# Patient Record
Sex: Female | Born: 1950
Health system: Southern US, Community
[De-identification: ages and names within clinical notes are randomized; demographics above are authoritative.]

## PROBLEM LIST (undated history)

## (undated) DIAGNOSIS — B009 Herpesviral infection, unspecified: Secondary | ICD-10-CM

## (undated) DIAGNOSIS — T8859XA Other complications of anesthesia, initial encounter: Secondary | ICD-10-CM

## (undated) DIAGNOSIS — M1990 Unspecified osteoarthritis, unspecified site: Secondary | ICD-10-CM

## (undated) DIAGNOSIS — I10 Essential (primary) hypertension: Secondary | ICD-10-CM

## (undated) DIAGNOSIS — T4145XA Adverse effect of unspecified anesthetic, initial encounter: Secondary | ICD-10-CM

## (undated) DIAGNOSIS — R112 Nausea with vomiting, unspecified: Secondary | ICD-10-CM

## (undated) DIAGNOSIS — M8580 Other specified disorders of bone density and structure, unspecified site: Secondary | ICD-10-CM

## (undated) DIAGNOSIS — Z9889 Other specified postprocedural states: Secondary | ICD-10-CM

## (undated) HISTORY — DX: Essential (primary) hypertension: I10

## (undated) HISTORY — DX: Herpesviral infection, unspecified: B00.9

## (undated) HISTORY — DX: Other specified disorders of bone density and structure, unspecified site: M85.80

## (undated) HISTORY — PX: TONSILLECTOMY: SUR1361

---

## 1982-06-22 HISTORY — PX: TUBAL LIGATION: SHX77

## 1998-12-23 ENCOUNTER — Other Ambulatory Visit: Admission: RE | Admit: 1998-12-23 | Discharge: 1998-12-23 | Payer: Self-pay | Admitting: Gynecology

## 1998-12-25 ENCOUNTER — Ambulatory Visit (HOSPITAL_COMMUNITY): Admission: RE | Admit: 1998-12-25 | Discharge: 1998-12-25 | Payer: Self-pay | Admitting: Obstetrics and Gynecology

## 1998-12-25 ENCOUNTER — Encounter: Payer: Self-pay | Admitting: Obstetrics and Gynecology

## 1999-10-27 ENCOUNTER — Other Ambulatory Visit: Admission: RE | Admit: 1999-10-27 | Discharge: 1999-10-27 | Payer: Self-pay | Admitting: Gynecology

## 1999-12-29 ENCOUNTER — Encounter: Payer: Self-pay | Admitting: Obstetrics and Gynecology

## 1999-12-29 ENCOUNTER — Ambulatory Visit (HOSPITAL_COMMUNITY): Admission: RE | Admit: 1999-12-29 | Discharge: 1999-12-29 | Payer: Self-pay | Admitting: Obstetrics and Gynecology

## 2000-02-16 ENCOUNTER — Other Ambulatory Visit: Admission: RE | Admit: 2000-02-16 | Discharge: 2000-02-16 | Payer: Self-pay | Admitting: Gynecology

## 2001-12-30 ENCOUNTER — Ambulatory Visit (HOSPITAL_COMMUNITY): Admission: RE | Admit: 2001-12-30 | Discharge: 2001-12-30 | Payer: Self-pay | Admitting: Obstetrics and Gynecology

## 2001-12-30 ENCOUNTER — Encounter: Payer: Self-pay | Admitting: Obstetrics and Gynecology

## 2001-12-30 ENCOUNTER — Other Ambulatory Visit: Admission: RE | Admit: 2001-12-30 | Discharge: 2001-12-30 | Payer: Self-pay | Admitting: Gynecology

## 2002-01-16 ENCOUNTER — Encounter: Payer: Self-pay | Admitting: Obstetrics and Gynecology

## 2002-01-16 ENCOUNTER — Encounter: Admission: RE | Admit: 2002-01-16 | Discharge: 2002-01-16 | Payer: Self-pay | Admitting: Obstetrics and Gynecology

## 2003-02-05 ENCOUNTER — Encounter: Payer: Self-pay | Admitting: Obstetrics and Gynecology

## 2003-02-05 ENCOUNTER — Other Ambulatory Visit: Admission: RE | Admit: 2003-02-05 | Discharge: 2003-02-05 | Payer: Self-pay | Admitting: Gynecology

## 2003-02-05 ENCOUNTER — Ambulatory Visit (HOSPITAL_COMMUNITY): Admission: RE | Admit: 2003-02-05 | Discharge: 2003-02-05 | Payer: Self-pay | Admitting: Obstetrics and Gynecology

## 2004-02-20 ENCOUNTER — Other Ambulatory Visit: Admission: RE | Admit: 2004-02-20 | Discharge: 2004-02-20 | Payer: Self-pay | Admitting: Gynecology

## 2004-02-20 ENCOUNTER — Ambulatory Visit (HOSPITAL_COMMUNITY): Admission: RE | Admit: 2004-02-20 | Discharge: 2004-02-20 | Payer: Self-pay | Admitting: Gynecology

## 2005-02-27 ENCOUNTER — Ambulatory Visit (HOSPITAL_COMMUNITY): Admission: RE | Admit: 2005-02-27 | Discharge: 2005-02-27 | Payer: Self-pay | Admitting: Gynecology

## 2005-02-27 ENCOUNTER — Other Ambulatory Visit: Admission: RE | Admit: 2005-02-27 | Discharge: 2005-02-27 | Payer: Self-pay | Admitting: Gynecology

## 2006-06-21 ENCOUNTER — Other Ambulatory Visit: Admission: RE | Admit: 2006-06-21 | Discharge: 2006-06-21 | Payer: Self-pay | Admitting: Obstetrics and Gynecology

## 2006-06-21 ENCOUNTER — Ambulatory Visit (HOSPITAL_COMMUNITY): Admission: RE | Admit: 2006-06-21 | Discharge: 2006-06-21 | Payer: Self-pay | Admitting: Gynecology

## 2007-02-07 ENCOUNTER — Emergency Department (HOSPITAL_COMMUNITY): Admission: EM | Admit: 2007-02-07 | Discharge: 2007-02-07 | Payer: Self-pay | Admitting: Emergency Medicine

## 2008-06-21 ENCOUNTER — Ambulatory Visit (HOSPITAL_COMMUNITY): Admission: RE | Admit: 2008-06-21 | Discharge: 2008-06-21 | Payer: Self-pay | Admitting: Gynecology

## 2008-06-21 ENCOUNTER — Encounter: Payer: Self-pay | Admitting: Women's Health

## 2008-06-21 ENCOUNTER — Ambulatory Visit: Payer: Self-pay | Admitting: Women's Health

## 2008-06-21 ENCOUNTER — Other Ambulatory Visit: Admission: RE | Admit: 2008-06-21 | Discharge: 2008-06-21 | Payer: Self-pay | Admitting: Obstetrics and Gynecology

## 2008-11-20 ENCOUNTER — Emergency Department (HOSPITAL_COMMUNITY): Admission: EM | Admit: 2008-11-20 | Discharge: 2008-11-21 | Payer: Self-pay | Admitting: Emergency Medicine

## 2009-11-29 ENCOUNTER — Ambulatory Visit: Payer: Self-pay | Admitting: Women's Health

## 2009-11-29 ENCOUNTER — Other Ambulatory Visit: Admission: RE | Admit: 2009-11-29 | Discharge: 2009-11-29 | Payer: Self-pay | Admitting: Gynecology

## 2009-11-29 ENCOUNTER — Ambulatory Visit (HOSPITAL_COMMUNITY): Admission: RE | Admit: 2009-11-29 | Discharge: 2009-11-29 | Payer: Self-pay | Admitting: Gynecology

## 2010-02-13 ENCOUNTER — Ambulatory Visit: Payer: Self-pay | Admitting: Women's Health

## 2010-03-21 ENCOUNTER — Ambulatory Visit: Payer: Self-pay | Admitting: Gynecology

## 2010-04-11 ENCOUNTER — Ambulatory Visit: Payer: Self-pay | Admitting: Gynecology

## 2010-07-13 ENCOUNTER — Encounter: Payer: Self-pay | Admitting: Gynecology

## 2010-09-29 LAB — COMPREHENSIVE METABOLIC PANEL
ALT: 13 U/L (ref 0–35)
AST: 25 U/L (ref 0–37)
Albumin: 3.5 g/dL (ref 3.5–5.2)
Alkaline Phosphatase: 82 U/L (ref 39–117)
BUN: 3 mg/dL — ABNORMAL LOW (ref 6–23)
CO2: 28 mEq/L (ref 19–32)
Calcium: 9.2 mg/dL (ref 8.4–10.5)
Chloride: 90 mEq/L — ABNORMAL LOW (ref 96–112)
Creatinine, Ser: 0.69 mg/dL (ref 0.4–1.2)
GFR calc Af Amer: >60 mL/min (ref >60)
GFR calc non Af Amer: >60 mL/min (ref >60)
Glucose, Bld: 122 mg/dL — ABNORMAL HIGH (ref 70–99)
Potassium: 3.6 mEq/L (ref 3.5–5.1)
Sodium: 129 mEq/L — ABNORMAL LOW (ref 135–145)
Total Bilirubin: 0.5 mg/dL (ref 0.3–1.2)
Total Protein: 6.4 g/dL (ref 6.0–8.3)

## 2010-09-29 LAB — URINE CULTURE
Colony Count: NO GROWTH
Culture: NO GROWTH

## 2010-09-29 LAB — URINALYSIS, ROUTINE W REFLEX MICROSCOPIC
Bilirubin Urine: NEGATIVE
Glucose, UA: NEGATIVE mg/dL
Hgb urine dipstick: NEGATIVE
Ketones, ur: NEGATIVE mg/dL
Nitrite: NEGATIVE
Protein, ur: NEGATIVE mg/dL
Specific Gravity, Urine: 1.004 — ABNORMAL LOW (ref 1.005–1.030)
Urobilinogen, UA: 0.2 mg/dL (ref 0.0–1.0)
pH: 6 (ref 5.0–8.0)

## 2010-09-29 LAB — CBC
HCT: 33.7 % — ABNORMAL LOW (ref 36.0–46.0)
Hemoglobin: 11.4 g/dL — ABNORMAL LOW (ref 12.0–15.0)
MCHC: 33.9 g/dL (ref 30.0–36.0)
MCV: 95.2 fL (ref 78.0–100.0)
Platelets: 460 10*3/uL — ABNORMAL HIGH (ref 150–400)
RBC: 3.54 MIL/uL — ABNORMAL LOW (ref 3.87–5.11)
RDW: 12.7 % (ref 11.5–15.5)
WBC: 10.9 10*3/uL — ABNORMAL HIGH (ref 4.0–10.5)

## 2010-09-29 LAB — DIFFERENTIAL
Basophils Absolute: 0.1 10*3/uL (ref 0.0–0.1)
Basophils Relative: 1 % (ref 0–1)
Eosinophils Absolute: 0.2 10*3/uL (ref 0.0–0.7)
Eosinophils Relative: 2 % (ref 0–5)
Lymphocytes Relative: 17 % (ref 12–46)
Lymphs Abs: 1.9 10*3/uL (ref 0.7–4.0)
Monocytes Absolute: 1.1 10*3/uL — ABNORMAL HIGH (ref 0.1–1.0)
Monocytes Relative: 10 % (ref 3–12)
Neutro Abs: 7.7 10*3/uL (ref 1.7–7.7)
Neutrophils Relative %: 70 % (ref 43–77)

## 2010-09-29 LAB — POCT CARDIAC MARKERS
CKMB, poc: 1.3 ng/mL (ref 1.0–8.0)
Myoglobin, poc: 108 ng/mL (ref 12–200)
Troponin i, poc: <0.05 ng/mL (ref 0.00–0.09)

## 2010-11-04 NOTE — Consult Note (Signed)
NAME:  Evelyn Nelson, Evelyn Nelson NO.:  [id number]   MEDICAL RECORD NO.:  [id number]          PATIENT TYPE:  EMS   LOCATION:  ED                           FACILITY:  [facility]   PHYSICIAN:  Wilson Singer, M.D.DATE OF BIRTH:  1950-11-17   DATE OF CONSULTATION:  02/07/2007  DATE OF DISCHARGE:                                 CONSULTATION   PRIMARY CARE PHYSICIAN:  Lianne Bushy, M.D.   REFERRING PHYSICIAN:  Joen Laura, M.D. in the emergency room.   HISTORY:  This is a very pleasant 61 year old lady who had a less than  30 second episode of loss of consciousness while in the shower this  morning.  She was feeling perfectly well yesterday and in the last few  weeks and she went into the shower and felt lightheaded and passed out.  She regained consciousness within less than 30 seconds and was perfectly  orientated in time, place, and person.  There was no tongue biting,  urinary incontinence.  There was no history of chest pain or  palpitations prior to post the event.  She has not had any recent such  symptoms in the last few weeks.  She has had 1 episode of passing out  before but this was when she had gastroenteritis and was told she was  severely dehydrated.  She has no history of coronary artery disease in  the past and there is no history of arrhythmia in the past.  She has no  history of seizure disorder in the past.   PAST MEDICAL HISTORY:  No serious illnesses.   PAST SURGICAL HISTORY:  No operations.   MEDICATIONS:  None.   ALLERGIES:  None.   SOCIAL HISTORY:  She does not smoke and does not drink alcohol  excessively.  She is divorced and lives alone with her son.   REVIEW OF SYSTEMS:  Apart from the symptoms mentioned above, there were  no other symptoms reviewed in all systems.   PHYSICAL EXAMINATION:  VITAL SIGNS:  She is afebrile and hemodynamically  stable.  Temperature 97, blood pressure 122/76, pulse 70, respiratory  rate 12, saturation 100%.  GENERAL:  She looks well.  HEART:  Sounds are presently normal without murmurs.  LUNGS:  Fields are clear.  NEUROLOGIC:  There are no focal neurologic signs.  She is alert and  oriented.  ABDOMEN:  Soft and nontender with no evidence of abdominal aortic  aneurysm.   INVESTIGATIONS:  Show hemoglobin 12, white blood cell count 9.4, with  platelets 291.  Sodium 138, potassium 3.6, bicarbonate 22, glucose 87,  BUN 11, creatinine 0.73.  Electrocardiogram done in the emergency room  shows a normal sinus rhythm and is within normal limits.   IMPRESSION:  Syncope likely vasovagal/fainting.   PLAN:  I think this patient can go home and be investigated as an  outpatient.  I have told her to drink plenty of fluids today to make  sure she is well hydrated.      Wilson Singer, M.D.  Electronically Signed     NCG/MEDQ  D:  02/07/2007  T:  02/07/2007  Job:  [id number]   cc:   Lianne Bushy, M.D.  Fax: [phone]

## 2010-11-10 ENCOUNTER — Other Ambulatory Visit: Payer: Self-pay | Admitting: Women's Health

## 2010-11-10 DIAGNOSIS — Z1231 Encounter for screening mammogram for malignant neoplasm of breast: Secondary | ICD-10-CM

## 2010-12-12 ENCOUNTER — Encounter (INDEPENDENT_AMBULATORY_CARE_PROVIDER_SITE_OTHER): Payer: BC Managed Care – PPO | Admitting: Women's Health

## 2010-12-12 ENCOUNTER — Other Ambulatory Visit (HOSPITAL_COMMUNITY)
Admission: RE | Admit: 2010-12-12 | Discharge: 2010-12-12 | Disposition: A | Discharge status: Home / Self Care | Payer: BC Managed Care – PPO | Source: Ambulatory Visit | Attending: Gynecology | Admitting: Gynecology

## 2010-12-12 ENCOUNTER — Other Ambulatory Visit: Payer: Self-pay | Admitting: Women's Health

## 2010-12-12 ENCOUNTER — Ambulatory Visit (HOSPITAL_COMMUNITY)
Admission: RE | Admit: 2010-12-12 | Discharge: 2010-12-12 | Disposition: A | Discharge status: Home / Self Care | Payer: BC Managed Care – PPO | Source: Ambulatory Visit | Attending: Women's Health | Admitting: Women's Health

## 2010-12-12 DIAGNOSIS — Z124 Encounter for screening for malignant neoplasm of cervix: Secondary | ICD-10-CM | POA: Insufficient documentation

## 2010-12-12 DIAGNOSIS — Z833 Family history of diabetes mellitus: Secondary | ICD-10-CM

## 2010-12-12 DIAGNOSIS — Z1231 Encounter for screening mammogram for malignant neoplasm of breast: Secondary | ICD-10-CM

## 2010-12-12 DIAGNOSIS — Z13220 Encounter for screening for lipoid disorders: Secondary | ICD-10-CM

## 2010-12-12 DIAGNOSIS — Z01419 Encounter for gynecological examination (general) (routine) without abnormal findings: Secondary | ICD-10-CM

## 2011-01-21 ENCOUNTER — Encounter: Payer: Self-pay | Admitting: Women's Health

## 2011-01-21 ENCOUNTER — Telehealth: Payer: Self-pay | Admitting: Women's Health

## 2011-01-21 ENCOUNTER — Encounter: Payer: Self-pay | Admitting: *Deleted

## 2011-01-21 NOTE — Telephone Encounter (Signed)
Left to phone message is to call office regarding cholesterol levels. Patient returned call requesting  A letter be sent with information. A letter was sent.

## 2011-04-03 LAB — URINALYSIS, ROUTINE W REFLEX MICROSCOPIC
Bilirubin Urine: NEGATIVE
Glucose, UA: NEGATIVE
Hgb urine dipstick: NEGATIVE
Ketones, ur: NEGATIVE
Nitrite: NEGATIVE
Protein, ur: NEGATIVE
Specific Gravity, Urine: 1.016
Urobilinogen, UA: 0.2
pH: 5

## 2011-04-03 LAB — BASIC METABOLIC PANEL
BUN: 11
CO2: 22
Calcium: 8.8
Chloride: 105
Creatinine, Ser: 0.73
GFR calc Af Amer: >60
GFR calc non Af Amer: >60
Glucose, Bld: 87
Potassium: 3.6
Sodium: 138

## 2011-04-03 LAB — CBC
HCT: 35.1 — ABNORMAL LOW
Hemoglobin: 12
MCHC: 34.1
MCV: 94.4
Platelets: 291
RBC: 3.72 — ABNORMAL LOW
RDW: 13.2
WBC: 9.4

## 2011-04-03 LAB — DIFFERENTIAL
Basophils Absolute: 0
Basophils Relative: 0
Eosinophils Absolute: 0.2
Eosinophils Relative: 2
Lymphocytes Relative: 8 — ABNORMAL LOW
Lymphs Abs: 0.8
Monocytes Absolute: 0.5
Monocytes Relative: 6
Neutro Abs: 7.9 — ABNORMAL HIGH
Neutrophils Relative %: 84 — ABNORMAL HIGH

## 2011-04-03 LAB — POCT CARDIAC MARKERS
CKMB, poc: <1 — ABNORMAL LOW
CKMB, poc: <1 — ABNORMAL LOW
Myoglobin, poc: 65.1
Myoglobin, poc: 73.9
Operator id: 1627
Operator id: 4001
Troponin i, poc: <0.05
Troponin i, poc: <0.05

## 2012-03-01 ENCOUNTER — Other Ambulatory Visit: Payer: Self-pay | Admitting: Women's Health

## 2012-03-01 DIAGNOSIS — Z1231 Encounter for screening mammogram for malignant neoplasm of breast: Secondary | ICD-10-CM

## 2012-03-17 ENCOUNTER — Encounter: Payer: Self-pay | Admitting: Women's Health

## 2012-03-17 ENCOUNTER — Ambulatory Visit (HOSPITAL_COMMUNITY)
Admission: RE | Admit: 2012-03-17 | Discharge: 2012-03-17 | Disposition: A | Discharge status: Home / Self Care | Payer: BC Managed Care – PPO | Source: Ambulatory Visit | Attending: Women's Health | Admitting: Women's Health

## 2012-03-17 ENCOUNTER — Ambulatory Visit (INDEPENDENT_AMBULATORY_CARE_PROVIDER_SITE_OTHER): Payer: BC Managed Care – PPO | Admitting: Women's Health

## 2012-03-17 VITALS — BP 110/80 | Ht 67.0 in | Wt 167.0 lb

## 2012-03-17 DIAGNOSIS — M899 Disorder of bone, unspecified: Secondary | ICD-10-CM

## 2012-03-17 DIAGNOSIS — Z833 Family history of diabetes mellitus: Secondary | ICD-10-CM

## 2012-03-17 DIAGNOSIS — Z1231 Encounter for screening mammogram for malignant neoplasm of breast: Secondary | ICD-10-CM | POA: Insufficient documentation

## 2012-03-17 DIAGNOSIS — Z01419 Encounter for gynecological examination (general) (routine) without abnormal findings: Secondary | ICD-10-CM

## 2012-03-17 DIAGNOSIS — Z13220 Encounter for screening for lipoid disorders: Secondary | ICD-10-CM

## 2012-03-17 DIAGNOSIS — M8580 Other specified disorders of bone density and structure, unspecified site: Secondary | ICD-10-CM

## 2012-03-17 LAB — CBC WITH DIFFERENTIAL/PLATELET
Basophils Absolute: 0.1 10*3/uL (ref 0.0–0.1)
Basophils Relative: 1 % (ref 0–1)
Eosinophils Absolute: 0.6 10*3/uL (ref 0.0–0.7)
Eosinophils Relative: 8 % — ABNORMAL HIGH (ref 0–5)
HCT: 38.2 % (ref 36.0–46.0)
Hemoglobin: 13 g/dL (ref 12.0–15.0)
Lymphocytes Relative: 28 % (ref 12–46)
Lymphs Abs: 2.2 10*3/uL (ref 0.7–4.0)
MCH: 30.7 pg (ref 26.0–34.0)
MCHC: 34 g/dL (ref 30.0–36.0)
MCV: 90.1 fL (ref 78.0–100.0)
Monocytes Absolute: 0.6 10*3/uL (ref 0.1–1.0)
Monocytes Relative: 8 % (ref 3–12)
Neutro Abs: 4.5 10*3/uL (ref 1.7–7.7)
Neutrophils Relative %: 55 % (ref 43–77)
Platelets: 272 10*3/uL (ref 150–400)
RBC: 4.24 MIL/uL (ref 3.87–5.11)
RDW: 12.9 % (ref 11.5–15.5)
WBC: 7.9 10*3/uL (ref 4.0–10.5)

## 2012-03-17 NOTE — Progress Notes (Signed)
Evelyn Nelson 1951-01-22 [id number]    History:    The patient presents for annual exam.  Postmenopausal on no HRT with no bleeding. History of normal Paps and mammograms. DEXA 2011 osteopenia T score -1.9 at left femoral neck. Had a normal PTH, calcium level and comprehensive metabolic panel. Fractious a ten-year probability of a major fractured 18% in the hip fracture 1.2%. She is taking 2009 use vitamin D daily and calcium rich diet with supplement. Has not had a colonoscopy. Had a knee injury this past year from a fall.   Past medical history, past surgical history, family history and social history were all reviewed and documented in the EPIC chart. Works at a [employer]. Has 2 children. Not sexually active. Son has lymph node Hodgkin's cancer.    ROS:  A  ROS was performed and pertinent positives and negatives are included in the history.  Exam:  Filed Vitals:   03/17/12 0923  BP: 110/80    General appearance:  Normal Head/Neck:  Normal, without cervical or supraclavicular adenopathy. Thyroid:  Symmetrical, normal in size, without palpable masses or nodularity. Respiratory  Effort:  Normal  Auscultation:  Clear without wheezing or rhonchi Cardiovascular  Auscultation:  Regular rate, without rubs, murmurs or gallops  Edema/varicosities:  Not grossly evident Abdominal  Soft,nontender, without masses, guarding or rebound.  Liver/spleen:  No organomegaly noted  Hernia:  None appreciated  Skin  Inspection:  Grossly normal  Palpation:  Grossly normal Neurologic/psychiatric  Orientation:  Normal with appropriate conversation.  Mood/affect:  Normal  Genitourinary    Breasts: Examined lying and sitting.     Right: Without masses, retractions, discharge or axillary adenopathy.     Left: Without masses, retractions, discharge or axillary adenopathy.   Inguinal/mons:  Normal without inguinal adenopathy  External genitalia:  Normal  BUS/Urethra/Skene's glands:   Normal  Bladder:  Normal  Vagina:  +1 cystocele   Cervix:  Normal  Uterus:  normal in size, shape and contour.  Midline and mobile  Adnexa/parametria:     Rt: Without masses or tenderness.   Lt: Without masses or tenderness.  Anus and perineum: Normal  Digital rectal exam: Normal sphincter tone without palpated masses or tenderness  Assessment/Plan:  61 y.o. DW F. G2 P2  for annual exam with no complaints.  Postmenopausal  no HRT or bleeding/ +1 cystocele with stress incontinence Osteopenia  T score of -1.9 at our on the 2011  Plan: Repeat bone density will schedule. Has not had a screening colonoscopy reviewed importance states will schedule. SBE's, continue annual mammogram, calcium rich diet, continued vitamin D 2000 daily and exercise encouraged. Home safety and fall prevention discussed. CBC, lipid panel, glucose, UA, home Hemoccult card given. No Pap history of normal Paps, new screening guidelines reviewed. Not sexually active reviewed condom use if becomes.      [name] [facility], 9:58 AM 03/17/2012

## 2012-03-17 NOTE — Patient Instructions (Addendum)

## 2012-03-18 LAB — LIPID PANEL
Cholesterol: 220 mg/dL — ABNORMAL HIGH (ref 0–200)
HDL: 115 mg/dL (ref >39)
LDL Cholesterol: 92 mg/dL (ref 0–99)
Total CHOL/HDL Ratio: 1.9 Ratio
Triglycerides: 67 mg/dL (ref <150)
VLDL: 13 mg/dL (ref 0–40)

## 2012-03-18 LAB — URINALYSIS W MICROSCOPIC + REFLEX CULTURE
Bacteria, UA: NONE SEEN
Bilirubin Urine: NEGATIVE
Casts: NONE SEEN
Crystals: NONE SEEN
Glucose, UA: NEGATIVE mg/dL
Hgb urine dipstick: NEGATIVE
Ketones, ur: NEGATIVE mg/dL
Leukocytes, UA: NEGATIVE
Nitrite: NEGATIVE
Protein, ur: NEGATIVE mg/dL
Specific Gravity, Urine: <1.005 — ABNORMAL LOW (ref 1.005–1.030)
Squamous Epithelial / LPF: NONE SEEN
Urobilinogen, UA: 0.2 mg/dL (ref 0.0–1.0)
pH: 7.5 (ref 5.0–8.0)

## 2012-03-18 LAB — GLUCOSE, RANDOM: Glucose, Bld: 85 mg/dL (ref 70–99)

## 2012-03-21 ENCOUNTER — Encounter: Payer: Self-pay | Admitting: Gynecology

## 2012-03-21 ENCOUNTER — Encounter: Payer: Self-pay | Admitting: Women's Health

## 2012-06-29 ENCOUNTER — Encounter: Payer: Self-pay | Admitting: Anesthesiology

## 2013-03-03 ENCOUNTER — Other Ambulatory Visit: Payer: Self-pay | Admitting: Women's Health

## 2013-03-03 DIAGNOSIS — Z1231 Encounter for screening mammogram for malignant neoplasm of breast: Secondary | ICD-10-CM

## 2013-03-24 ENCOUNTER — Other Ambulatory Visit (HOSPITAL_COMMUNITY)
Admission: RE | Admit: 2013-03-24 | Discharge: 2013-03-24 | Disposition: A | Discharge status: Home / Self Care | Payer: BC Managed Care – PPO | Source: Ambulatory Visit | Attending: Gynecology | Admitting: Gynecology

## 2013-03-24 ENCOUNTER — Ambulatory Visit (INDEPENDENT_AMBULATORY_CARE_PROVIDER_SITE_OTHER): Payer: BC Managed Care – PPO | Admitting: Women's Health

## 2013-03-24 ENCOUNTER — Ambulatory Visit (HOSPITAL_COMMUNITY)
Admission: RE | Admit: 2013-03-24 | Discharge: 2013-03-24 | Disposition: A | Discharge status: Home / Self Care | Payer: BC Managed Care – PPO | Source: Ambulatory Visit | Attending: Women's Health | Admitting: Women's Health

## 2013-03-24 ENCOUNTER — Encounter: Payer: Self-pay | Admitting: Women's Health

## 2013-03-24 VITALS — BP 108/72 | Ht 66.0 in | Wt 159.4 lb

## 2013-03-24 DIAGNOSIS — Z833 Family history of diabetes mellitus: Secondary | ICD-10-CM

## 2013-03-24 DIAGNOSIS — Z01419 Encounter for gynecological examination (general) (routine) without abnormal findings: Secondary | ICD-10-CM

## 2013-03-24 DIAGNOSIS — Z1231 Encounter for screening mammogram for malignant neoplasm of breast: Secondary | ICD-10-CM

## 2013-03-24 DIAGNOSIS — Z13220 Encounter for screening for lipoid disorders: Secondary | ICD-10-CM

## 2013-03-24 DIAGNOSIS — M899 Disorder of bone, unspecified: Secondary | ICD-10-CM

## 2013-03-24 DIAGNOSIS — M8580 Other specified disorders of bone density and structure, unspecified site: Secondary | ICD-10-CM

## 2013-03-24 LAB — CBC WITH DIFFERENTIAL/PLATELET
Basophils Absolute: 0 10*3/uL (ref 0.0–0.1)
Basophils Relative: 1 % (ref 0–1)
Eosinophils Absolute: 0.4 10*3/uL (ref 0.0–0.7)
Eosinophils Relative: 7 % — ABNORMAL HIGH (ref 0–5)
HCT: 38.1 % (ref 36.0–46.0)
Hemoglobin: 13.1 g/dL (ref 12.0–15.0)
Lymphocytes Relative: 31 % (ref 12–46)
Lymphs Abs: 1.7 10*3/uL (ref 0.7–4.0)
MCH: 31 pg (ref 26.0–34.0)
MCHC: 34.4 g/dL (ref 30.0–36.0)
MCV: 90.3 fL (ref 78.0–100.0)
Monocytes Absolute: 0.5 10*3/uL (ref 0.1–1.0)
Monocytes Relative: 9 % (ref 3–12)
Neutro Abs: 2.9 10*3/uL (ref 1.7–7.7)
Neutrophils Relative %: 52 % (ref 43–77)
Platelets: 296 10*3/uL (ref 150–400)
RBC: 4.22 MIL/uL (ref 3.87–5.11)
RDW: 14.2 % (ref 11.5–15.5)
WBC: 5.6 10*3/uL (ref 4.0–10.5)

## 2013-03-24 LAB — LIPID PANEL
Cholesterol: 260 mg/dL — ABNORMAL HIGH (ref 0–200)
HDL: 132 mg/dL (ref >39)
LDL Cholesterol: 116 mg/dL — ABNORMAL HIGH (ref 0–99)
Total CHOL/HDL Ratio: 2 Ratio
Triglycerides: 58 mg/dL (ref <150)
VLDL: 12 mg/dL (ref 0–40)

## 2013-03-24 LAB — GLUCOSE, RANDOM: Glucose, Bld: 91 mg/dL (ref 70–99)

## 2013-03-24 NOTE — Progress Notes (Signed)
Evelyn Nelson 06/20/1951 [id number]    History:    The patient presents for annual exam.  Complains of occasional stress incontinence.  Fainting spells, relates this to dehydration and heat, negative CT of the head.    Not sexually active, or a smoker.  Has had shingles vaccine, flu vaccine will be given at job. 2011 DEXA T score  -1.9 femoral neck. FRAX 18%/1.2%.  No colonoscopy. Normal Pap and mammogram history.  Past medical history, past surgical history, family history and social history were all reviewed and documented in the EPIC chart. Works two jobs, stays active daily. Father Lung Cancer Son Hodgkin's Lymphoma and alcoholism doing much better.  ROS:  A  ROS was performed and pertinent positives and negatives are included in the history.  Exam:  Filed Vitals:   03/24/13 0954  BP: 108/72    General appearance:  Normal Head/Neck:  Normal, without cervical or supraclavicular adenopathy. Thyroid:  Symmetrical, normal in size, without palpable masses or nodularity. Respiratory  Effort:  Normal  Auscultation:  Clear without wheezing or rhonchi Cardiovascular  Auscultation:  Regular rate, without rubs, murmurs or gallops  Edema/varicosities:  Not grossly evident Abdominal  Soft,nontender, without masses, guarding or rebound.  Liver/spleen:  No organomegaly noted  Hernia:  None appreciated  Skin  Inspection:  Grossly normal  Palpation:  Grossly normal Neurologic/psychiatric  Orientation:  Normal with appropriate conversation.  Mood/affect:  Normal  Genitourinary    Breasts: Examined lying and sitting.     Right: Without masses, retractions, discharge or axillary adenopathy.     Left: Without masses, retractions, discharge or axillary adenopathy.   Inguinal/mons:  Normal without inguinal adenopathy  External genitalia:  Slight atrophy  BUS/Urethra/Skene's glands:  Normal  Bladder:  Normal  Vagina:  light pink, dry  Cervix:  Normal  Uterus:  Normal in size, shape and  contour.  Midline and mobile  Adnexa/parametria:     Rt: Without masses or tenderness.   Lt: Without masses or tenderness.  Anus and perineum: Normal  Digital rectal exam: Normal sphincter tone without palpated masses or tenderness  Assessment/Plan:  62 y.o. DWF G2P2 for annual exam no complaints.     Postmenopausal/no HRT/no bleeding Osteopenia  Plan: Instructed to schedule screening colonoscopy,  reviewed benefits.  Continue vitamin D 2000 and active lifestyle with healthy eating habits. Condoms encouraged if become sexually active. SBE's, continue annual mammogram, home safety and fall prevention discussed. Instructed to schedule a DEXA.  CBC, because, lipid panel, UA, Pap. Pap normal 2012, new screening guidelines reviewed.    Evelyn Nelson [facility], 10:31 AM 03/24/2013

## 2013-03-24 NOTE — Patient Instructions (Addendum)
Dr Mann-colonoscopy or Baird Lyons GI Vit D 2000 daily Health Recommendations for Postmenopausal Women Respected and ongoing research has looked at the most common causes of death, disability, and poor quality of life in postmenopausal women. The causes include heart disease, diseases of blood vessels, diabetes, depression, cancer, and bone loss (osteoporosis). Many things can be done to help lower the chances of developing these and other common problems: CARDIOVASCULAR DISEASE Heart Disease: A heart attack is a medical emergency. Know the signs and symptoms of a heart attack. Below are things women can do to reduce their risk for heart disease.   Do not smoke. If you smoke, quit.  Aim for a healthy weight. Being overweight causes many preventable deaths. Eat a healthy and balanced diet and drink an adequate amount of liquids.  Get moving. Make a commitment to be more physically active. Aim for 30 minutes of activity on most, if not all days of the week.  Eat for heart health. Choose a diet that is low in saturated fat and cholesterol and eliminate trans fat. Include whole grains, vegetables, and fruits. Read and understand the labels on food containers before buying.  Know your numbers. Ask your caregiver to check your blood pressure, cholesterol (total, HDL, LDL, triglycerides) and blood glucose. Work with your caregiver on improving your entire clinical picture.  High blood pressure. Limit or stop your table salt intake (try salt substitute and food seasonings). Avoid salty foods and drinks. Read labels on food containers before buying. Eating well and exercising can help control high blood pressure. STROKE  Stroke is a medical emergency. Stroke may be the result of a blood clot in a blood vessel in the brain or by a brain hemorrhage (bleeding). Know the signs and symptoms of a stroke. To lower the risk of developing a stroke:  Avoid fatty foods.  Quit smoking.  Control your diabetes, blood  pressure, and irregular heart rate. THROMBOPHLEBITIS (BLOOD CLOT) OF THE LEG  Becoming overweight and leading a stationary lifestyle may also contribute to developing blood clots. Controlling your diet and exercising will help lower the risk of developing blood clots. CANCER SCREENING  Breast Cancer: Take steps to reduce your risk of breast cancer.  You should practice "breast self-awareness." This means understanding the normal appearance and feel of your breasts and should include breast self-examination. Any changes detected, no matter how small, should be reported to your caregiver.  After age 13, you should have a clinical breast exam (CBE) every year.  Starting at age 40, you should consider having a mammogram (breast X-ray) every year.  If you have a family history of breast cancer, talk to your caregiver about genetic screening.  If you are at high risk for breast cancer, talk to your caregiver about having an MRI and a mammogram every year.  Intestinal or Stomach Cancer: Tests to consider are a rectal exam, fecal occult blood, sigmoidoscopy, and colonoscopy. Women who are high risk may need to be screened at an earlier age and more often.  Cervical Cancer:  Beginning at age 83, you should have a Pap test every 3 years as long as the past 3 Pap tests have been normal.  If you have had past treatment for cervical cancer or a condition that could lead to cancer, you need Pap tests and screening for cancer for at least 20 years after your treatment.  If you had a hysterectomy for a problem that was not cancer or a condition that could lead to  cancer, then you no longer need Pap tests.  If you are between ages 32 and 60, and you have had normal Pap tests going back 10 years, you no longer need Pap tests.  If Pap tests have been discontinued, risk factors (such as a new sexual partner) need to be reassessed to determine if screening should be resumed.  Some medical problems can  increase the chance of getting cervical cancer. In these cases, your caregiver may recommend more frequent screening and Pap tests.  Uterine Cancer: If you have vaginal bleeding after reaching menopause, you should notify your caregiver.  Ovarian cancer: Other than yearly pelvic exams, there are no reliable tests available to screen for ovarian cancer at this time except for yearly pelvic exams.  Lung Cancer: Yearly chest X-rays can detect lung cancer and should be done on high risk women, such as cigarette smokers and women with chronic lung disease (emphysema).  Skin Cancer: A complete body skin exam should be done at your yearly examination. Avoid overexposure to the sun and ultraviolet light lamps. Use a strong sun block cream when in the sun. All of these things are important in lowering the risk of skin cancer. MENOPAUSE Menopause Symptoms: Hormone therapy products are effective for treating symptoms associated with menopause:  Moderate to severe hot flashes.  Night sweats.  Mood swings.  Headaches.  Tiredness.  Loss of sex drive.  Insomnia.  Other symptoms. Hormone replacement carries certain risks, especially in older women. Women who use or are thinking about using estrogen or estrogen with progestin treatments should discuss that with their caregiver. Your caregiver will help you understand the benefits and risks. The ideal dose of hormone replacement therapy is not known. The Food and Drug Administration (FDA) has concluded that hormone therapy should be used only at the lowest doses and for the shortest amount of time to reach treatment goals.  OSTEOPOROSIS Protecting Against Bone Loss and Preventing Fracture: If you use hormone therapy for prevention of bone loss (osteoporosis), the risks for bone loss must outweigh the risk of the therapy. Ask your caregiver about other medications known to be safe and effective for preventing bone loss and fractures. To guard against bone  loss or fractures, the following is recommended:  If you are less than age 97, take 1000 mg of calcium and at least 600 mg of Vitamin D per day.  If you are greater than age 36 but less than age 34, take 1200 mg of calcium and at least 600 mg of Vitamin D per day.  If you are greater than age 22, take 1200 mg of calcium and at least 800 mg of Vitamin D per day. Smoking and excessive alcohol intake increases the risk of osteoporosis. Eat foods rich in calcium and vitamin D and do weight bearing exercises several times a week as your caregiver suggests. DIABETES Diabetes Melitus: If you have Type I or Type 2 diabetes, you should keep your blood sugar under control with diet, exercise and recommended medication. Avoid too many sweets, starchy and fatty foods. Being overweight can make control more difficult. COGNITION AND MEMORY Cognition and Memory: Menopausal hormone therapy is not recommended for the prevention of cognitive disorders such as Alzheimer's disease or memory loss.  DEPRESSION  Depression may occur at any age, but is common in elderly women. The reasons may be because of physical, medical, social (loneliness), or financial problems and needs. If you are experiencing depression because of medical problems and control of symptoms,  talk to your caregiver about this. Physical activity and exercise may help with mood and sleep. Community and volunteer involvement may help your sense of value and worth. If you have depression and you feel that the problem is getting worse or becoming severe, talk to your caregiver about treatment options that are best for you. ACCIDENTS  Accidents are common and can be serious in the elderly woman. Prepare your house to prevent accidents. Eliminate throw rugs, place hand bars in the bath, shower and toilet areas. Avoid wearing high heeled shoes or walking on wet, snowy, and icy areas. Limit or stop driving if you have vision or hearing problems, or you feel you  are unsteady with you movements and reflexes. HEPATITIS C Hepatitis C is a type of viral infection affecting the liver. It is spread mainly through contact with blood from an infected person. It can be treated, but if left untreated, it can lead to severe liver damage over years. Many people who are infected do not know that the virus is in their blood. If you are a "baby-boomer", it is recommended that you have one screening test for Hepatitis C. IMMUNIZATIONS  Several immunizations are important to consider having during your senior years, including:   Tetanus, diptheria, and pertussis booster shot.  Influenza every year before the flu season begins.  Pneumonia vaccine.  Shingles vaccine.  Others as indicated based on your specific needs. Talk to your caregiver about these. Document Released: 07/31/2005 Document Revised: 05/25/2012 Document Reviewed: 03/26/2008 [facility] Patient Information 2014 [place], [place].

## 2013-03-28 ENCOUNTER — Other Ambulatory Visit: Payer: Self-pay | Admitting: Gynecology

## 2013-03-28 DIAGNOSIS — M8580 Other specified disorders of bone density and structure, unspecified site: Secondary | ICD-10-CM

## 2013-04-27 ENCOUNTER — Ambulatory Visit (INDEPENDENT_AMBULATORY_CARE_PROVIDER_SITE_OTHER): Payer: BC Managed Care – PPO

## 2013-04-27 ENCOUNTER — Telehealth: Payer: Self-pay | Admitting: Gynecology

## 2013-04-27 ENCOUNTER — Encounter: Payer: Self-pay | Admitting: Gynecology

## 2013-04-27 DIAGNOSIS — M8580 Other specified disorders of bone density and structure, unspecified site: Secondary | ICD-10-CM

## 2013-04-27 DIAGNOSIS — M899 Disorder of bone, unspecified: Secondary | ICD-10-CM

## 2013-04-27 NOTE — Telephone Encounter (Signed)
Left message for pt to call.

## 2013-04-27 NOTE — Telephone Encounter (Signed)
Recommend vitamin D level and TSH with office visit afterwards to discuss recent bone density and possible treatment options.

## 2013-04-28 NOTE — Telephone Encounter (Signed)
Pt informed with the below note. 

## 2013-04-28 NOTE — Telephone Encounter (Signed)
Order placed, pt will call back to schedule appointment.

## 2013-04-28 NOTE — Addendum Note (Signed)
Addended by: Aura Camps on: 04/28/2013 09:53 AM   Modules accepted: Orders

## 2013-05-01 ENCOUNTER — Other Ambulatory Visit: Payer: BC Managed Care – PPO

## 2013-05-01 DIAGNOSIS — M8580 Other specified disorders of bone density and structure, unspecified site: Secondary | ICD-10-CM

## 2013-05-02 LAB — VITAMIN D 25 HYDROXY (VIT D DEFICIENCY, FRACTURES): Vit D, 25-Hydroxy: 51 ng/mL (ref 30–89)

## 2013-05-02 LAB — TSH: TSH: 2.874 u[IU]/mL (ref 0.350–4.500)

## 2013-05-16 ENCOUNTER — Ambulatory Visit: Payer: BC Managed Care – PPO | Admitting: Gynecology

## 2013-05-29 ENCOUNTER — Ambulatory Visit (INDEPENDENT_AMBULATORY_CARE_PROVIDER_SITE_OTHER): Payer: BC Managed Care – PPO | Admitting: Gynecology

## 2013-05-29 ENCOUNTER — Encounter: Payer: Self-pay | Admitting: Gynecology

## 2013-05-29 DIAGNOSIS — M8580 Other specified disorders of bone density and structure, unspecified site: Secondary | ICD-10-CM

## 2013-05-29 DIAGNOSIS — M899 Disorder of bone, unspecified: Secondary | ICD-10-CM

## 2013-05-29 NOTE — Patient Instructions (Signed)
Follow up routinely for your annual exam when due. 

## 2013-05-29 NOTE — Progress Notes (Signed)
The patient presents to discuss recent DEXA with T score -1.8 FRAX calculated overall fracture risk at 30%. Hip at 2.1%. On review of her history for FRAX calculation she was rated at a personal history of fracture although on questioning she had fractured her kneecap when she fell on cement. I do not count this as a fragility fracture. I recalculated her FRAX with a 19% overall risk tenure probability and 1.2% risk of hip fracture. I reviewed with her based upon these numbers we do not need to initiate treatment at this point. She is at risk for long-term progression and we need to monitor her I recommended a repeat DEXA in 2 years. Her recent vitamin D was 51 and her TSH was normal. Increase calcium conditions reviewed.

## 2013-05-30 ENCOUNTER — Telehealth: Payer: Self-pay | Admitting: *Deleted

## 2013-05-30 NOTE — Telephone Encounter (Signed)
Message copied by Aura Camps on Tue May 30, 2013  9:21 AM ------      Message from: Dara Lords      Created: Mon May 29, 2013  4:56 PM       Tell patient that I did recalculate her fracture risk and it was below the recommended risk for treatment and I do not think that she needs to be treated at this point. I do recommend that we repeat her bone density in 2 years. ------

## 2013-05-30 NOTE — Telephone Encounter (Signed)
Left message on voicemail for pt to call.

## 2013-06-13 NOTE — Telephone Encounter (Signed)
Left message on pt voicemail

## 2013-06-13 NOTE — Telephone Encounter (Signed)
Pt informed with the below note. 

## 2013-08-29 ENCOUNTER — Encounter (HOSPITAL_COMMUNITY): Payer: Self-pay | Admitting: Emergency Medicine

## 2013-08-29 ENCOUNTER — Emergency Department (HOSPITAL_COMMUNITY)
Admission: EM | Admit: 2013-08-29 | Discharge: 2013-08-29 | Disposition: A | Discharge status: Home / Self Care | Payer: BC Managed Care – PPO | Source: Home / Self Care | Attending: Emergency Medicine | Admitting: Emergency Medicine

## 2013-08-29 ENCOUNTER — Emergency Department (INDEPENDENT_AMBULATORY_CARE_PROVIDER_SITE_OTHER): Payer: BC Managed Care – PPO

## 2013-08-29 DIAGNOSIS — J189 Pneumonia, unspecified organism: Secondary | ICD-10-CM

## 2013-08-29 MED ORDER — CLARITHROMYCIN 500 MG PO TABS: 500.0000 mg | ORAL_TABLET | Freq: Two times a day (BID) | ORAL | Status: DC

## 2013-08-29 MED ORDER — CEFTRIAXONE SODIUM 1 G IJ SOLR
INTRAMUSCULAR | Status: AC
  Filled 2013-08-29: qty 10

## 2013-08-29 MED ORDER — HYDROCOD POLST-CHLORPHEN POLST 10-8 MG/5ML PO LQCR: 5.0000 mL | Freq: Two times a day (BID) | ORAL | Status: DC | PRN

## 2013-08-29 MED ORDER — LIDOCAINE HCL (PF) 1 % IJ SOLN
INTRAMUSCULAR | Status: AC
  Filled 2013-08-29: qty 5

## 2013-08-29 MED ORDER — ALBUTEROL SULFATE HFA 108 (90 BASE) MCG/ACT IN AERS: 2.0000 | INHALATION_SPRAY | Freq: Four times a day (QID) | RESPIRATORY_TRACT | Status: DC

## 2013-08-29 MED ORDER — CEFTRIAXONE SODIUM 1 G IJ SOLR
1.0000 g | Freq: Once | INTRAMUSCULAR | Status: AC
  Administered 2013-08-29: 1 g via INTRAMUSCULAR

## 2013-08-29 MED ORDER — FLUCONAZOLE 150 MG PO TABS: 150.0000 mg | ORAL_TABLET | Freq: Once | ORAL | Status: DC

## 2013-08-29 MED ORDER — SODIUM CHLORIDE 0.9 % IV SOLN
INTRAVENOUS | Status: DC
  Administered 2013-08-29: 10:00:00 via INTRAVENOUS

## 2013-08-29 MED ORDER — CEFDINIR 300 MG PO CAPS: 300.0000 mg | ORAL_CAPSULE | Freq: Two times a day (BID) | ORAL | Status: DC

## 2013-08-29 NOTE — ED Notes (Signed)
C/o  Persistent nonproductive cough x 2 weeks.  Wheezing.  Denies fever, n/v/d.  No hx of asthma.  No relief with otc cough meds.

## 2013-08-29 NOTE — ED Notes (Signed)
Pt receiving IV fluids will discharge when finished.  Mw,cma

## 2013-08-29 NOTE — ED Notes (Signed)
Iv  Ns  Via  20 angio      r  Hand  1  Att  Bolus

## 2013-08-29 NOTE — ED Provider Notes (Signed)
Chief Complaint   Chief Complaint  Patient presents with  . Cough    History of Present Illness   Evelyn Nelson is a 63 year old female who has had a two-week history of dry cough, wheezing, chest tightness, chills, nasal congestion, sinus pressure, scratchy throat, and nausea. She has not had any chest pain, headache, vomiting, diarrhea, or abdominal pain. She saw her nurse practitioner in [place] last week and was given a Z-Pak but feels no better. The patient states that she has a history of low blood pressure and frequent dizzy spells.  Review of Systems   Other than as noted above, the patient denies any of the following symptoms: Systemic:  No fevers, chills, sweats, or myalgias. Eye:  No redness or discharge. ENT:  No ear pain, headache, nasal congestion, drainage, sinus pressure, or sore throat. Neck:  No neck pain, stiffness, or swollen glands. Lungs:  No cough, sputum production, hemoptysis, wheezing, chest tightness, shortness of breath or chest pain. GI:  No abdominal pain, nausea, vomiting or diarrhea.  [place]   Past medical history, family history, social history, meds, and allergies were reviewed.   Physical exam   Vital signs:  BP 81/42  Pulse 86  Temp(Src) 97.8 F (36.6 C) (Oral)  Resp 20  SpO2 98% Filed Vitals:   08/29/13 0913 08/29/13 0940 Supine  08/29/13 0941 Sitting  08/29/13 0941 Standing   BP: 90/50 92/52 86/51  81/42  Pulse: 89 77 86 86  Temp: 97.8 F (36.6 C)     TempSrc: Oral     Resp: 20     SpO2: 98%       General:  Alert and oriented.  In no distress.  Skin warm and dry. Eye:  No conjunctival injection or drainage. Lids were normal. ENT:  TMs and canals were normal, without erythema or inflammation.  Nasal mucosa was clear and uncongested, without drainage.  Mucous membranes were moist.  Pharynx was clear with no exudate or drainage.  There were no oral ulcerations or lesions. Neck:  Supple, no adenopathy, tenderness or mass. Lungs:   No respiratory distress.  Lungs were clear to auscultation, without wheezes, rales or rhonchi.  Breath sounds were clear and equal bilaterally.  Heart:  Regular rhythm, without gallops, murmers or rubs. Skin:  Clear, warm, and dry, without rash or lesions.  Radiology   Dg Chest 2 View  08/29/2013   CLINICAL DATA:  Cough and congestion for 2 weeks  EXAM: CHEST  2 VIEW  COMPARISON:  DG CHEST 2 VIEW dated 11/20/2008  FINDINGS: Stable mild cardiac enlargement. Vascular pattern normal. Mild infiltrate in the left lower lobe with no pleural effusion. Left lung otherwise clear. On the right side, there is a 2.5 x 1.5 cm ovoid area of upper lobe opacity, new from the prior study. No pleural effusions.  IMPRESSION: Bilateral opacities as described above. Left lower lobe opacity most consistent with pneumonia. Right upper lobe opacity could represent nodule or infiltrate. Follow-up chest radiographs after appropriate therapy necessary to ensure resolution. Failure of these findings to resolved would necessitate contrast-enhanced CT thorax.  These results will be called to the ordering clinician or representative by the Radiologist Assistant, and communication documented in the PACS Dashboard.   Electronically Signed   By: Skipper Cliche M.D.   On: 08/29/2013 10:00     Course in [facility]   While she was being examined she felt a little faint and dizzy. She was noted to be somewhat diaphoretic. Orthostatic  vital signs pain as above which showed some hypotension when she stood up with a systolic of 81. She was given a liter of fluid and felt better. Because of possible pneumonia she was given Rocephin 1 g IM.  Assessment     The encounter diagnosis was Community acquired pneumonia.  She will need a followup chest x-ray and I suggested getting this done about 2 weeks. If the pneumonia has not cleared, I would suggest a chest CT scan. She should return for short-term followup in 3 days to make sure that  she is doing well.  Plan    1.  Meds:  The following meds were prescribed:   New Prescriptions   ALBUTEROL (PROVENTIL HFA;VENTOLIN HFA) 108 (90 BASE) MCG/ACT INHALER    Inhale 2 puffs into the lungs 4 (four) times daily.   CEFDINIR (OMNICEF) 300 MG CAPSULE    Take 1 capsule (300 mg total) by mouth 2 (two) times daily.   CHLORPHENIRAMINE-HYDROCODONE (TUSSIONEX) 10-8 MG/5ML LQCR    Take 5 mLs by mouth every 12 (twelve) hours as needed for cough.   CLARITHROMYCIN (BIAXIN) 500 MG TABLET    Take 1 tablet (500 mg total) by mouth 2 (two) times daily.   FLUCONAZOLE (DIFLUCAN) 150 MG TABLET    Take 1 tablet (150 mg total) by mouth once.    2.  Patient Education/Counseling:  The patient was given appropriate handouts, self care instructions, and instructed in symptomatic relief.  Instructed to get extra fluids, rest, and use a cool mist vaporizer.    3.  Follow up:  The patient was told to follow up here in 3 to 4 days, or sooner if becoming worse in any way, and given some red flag symptoms such as increasing fever, difficulty breathing, chest pain, or persistent vomiting which would prompt immediate return.  She will need a repeat chest x-ray in 2 weeks. She can either see her nurse practitioner or followup here for that.     Harden Mo, MD 08/29/13 [phone]

## 2013-08-29 NOTE — Discharge Instructions (Signed)
Follow up in 3 days and again in 2 weeks (repeat chest x-ray)--either with NP or here.   Get extra fluids.     Pneumonia, Adult Pneumonia is an infection of the lungs.  CAUSES Pneumonia may be caused by bacteria or a virus. Usually, these infections are caused by breathing infectious particles into the lungs (respiratory tract). SYMPTOMS   Cough.  Fever.  Chest pain.  Increased rate of breathing.  Wheezing.  Mucus production. DIAGNOSIS  If you have the common symptoms of pneumonia, your caregiver will typically confirm the diagnosis with a chest X-ray. The X-ray will show an abnormality in the lung (pulmonary infiltrate) if you have pneumonia. Other tests of your blood, urine, or sputum may be done to find the specific cause of your pneumonia. Your caregiver may also do tests (blood gases or pulse oximetry) to see how well your lungs are working. TREATMENT  Some forms of pneumonia may be spread to other people when you cough or sneeze. You may be asked to wear a mask before and during your exam. Pneumonia that is caused by bacteria is treated with antibiotic medicine. Pneumonia that is caused by the influenza virus may be treated with an antiviral medicine. Most other viral infections must run their course. These infections will not respond to antibiotics.  PREVENTION A pneumococcal shot (vaccine) is available to prevent a common bacterial cause of pneumonia. This is usually suggested for:  People over 8 years old.  Patients on chemotherapy.  People with chronic lung problems, such as bronchitis or emphysema.  People with immune system problems. If you are over 65 or have a high risk condition, you may receive the pneumococcal vaccine if you have not received it before. In some countries, a routine influenza vaccine is also recommended. This vaccine can help prevent some cases of pneumonia.You may be offered the influenza vaccine as part of your care. If you smoke, it is time  to quit. You may receive instructions on how to stop smoking. Your caregiver can provide medicines and counseling to help you quit. HOME CARE INSTRUCTIONS   Cough suppressants may be used if you are losing too much rest. However, coughing protects you by clearing your lungs. You should avoid using cough suppressants if you can.  Your caregiver may have prescribed medicine if he or she thinks your pneumonia is caused by a bacteria or influenza. Finish your medicine even if you start to feel better.  Your caregiver may also prescribe an expectorant. This loosens the mucus to be coughed up.  Only take over-the-counter or prescription medicines for pain, discomfort, or fever as directed by your caregiver.  Do not smoke. Smoking is a common cause of bronchitis and can contribute to pneumonia. If you are a smoker and continue to smoke, your cough may last several weeks after your pneumonia has cleared.  A cold steam vaporizer or humidifier in your room or home may help loosen mucus.  Coughing is often worse at night. Sleeping in a semi-upright position in a recliner or using a couple pillows under your head will help with this.  Get rest as you feel it is needed. Your body will usually let you know when you need to rest. SEEK IMMEDIATE MEDICAL CARE IF:   Your illness becomes worse. This is especially true if you are elderly or weakened from any other disease.  You cannot control your cough with suppressants and are losing sleep.  You begin coughing up blood.  You develop pain  which is getting worse or is uncontrolled with medicines.  You have a fever.  Any of the symptoms which initially brought you in for treatment are getting worse rather than better.  You develop shortness of breath or chest pain. MAKE SURE YOU:   Understand these instructions.  Will watch your condition.  Will get help right away if you are not doing well or get worse. Document Released: 06/08/2005 Document  Revised: 08/31/2011 Document Reviewed: 08/28/2010 [facility] Patient Information 2014 [place], [place].

## 2013-09-02 ENCOUNTER — Encounter (HOSPITAL_COMMUNITY): Payer: Self-pay | Admitting: Emergency Medicine

## 2013-09-02 ENCOUNTER — Emergency Department (INDEPENDENT_AMBULATORY_CARE_PROVIDER_SITE_OTHER)
Admission: EM | Admit: 2013-09-02 | Discharge: 2013-09-02 | Disposition: A | Discharge status: Home / Self Care | Payer: BC Managed Care – PPO | Source: Home / Self Care | Attending: Emergency Medicine | Admitting: Emergency Medicine

## 2013-09-02 DIAGNOSIS — J189 Pneumonia, unspecified organism: Secondary | ICD-10-CM

## 2013-09-02 NOTE — Discharge Instructions (Signed)

## 2013-09-02 NOTE — ED Notes (Signed)
Follow up on cough States she is feeling a little better Still has a little wheezing States she is still taking antibiotics Cough is better but still present

## 2013-09-02 NOTE — ED Provider Notes (Signed)
  Chief Complaint   Chief Complaint  Patient presents with  . Follow-up    History of Present Illness   Evelyn Nelson is a 63 year old female who returns for followup on pneumonia. She was seen here 2 days ago. She had a small patch of pneumonia in the left lower lobe and right upper lobe. She was treated with injection of Rocephin plus Biaxin and Omnicef at home. She also was given albuterol inhaler. She returns today for followup stating that she feels a lot better. She still has slight wheezing and a minimal cough productive of small amounts of clear sputum. She denies any chest pain or shortness of breath. The patient states she feels about 80% improved. Her appetite is good energy level is coming back. She still has some nasal congestion. No fever, chills, or GI symptoms.  Review of Systems   Other than as noted above, the patient denies any of the following symptoms: Systemic:  No fevers, chills, sweats, or myalgias. Eye:  No redness or discharge. ENT:  No ear pain, headache, nasal congestion, drainage, sinus pressure, or sore throat. Neck:  No neck pain, stiffness, or swollen glands. Lungs:  No cough, sputum production, hemoptysis, wheezing, chest tightness, shortness of breath or chest pain. GI:  No abdominal pain, nausea, vomiting or diarrhea.  [place]   Past medical history, family history, social history, meds, and allergies were reviewed.   Physical exam   Vital signs:  BP 136/75  Pulse 74  Temp(Src) 98.1 F (36.7 C) (Oral)  Resp 16  SpO2 100% General:  Alert and oriented.  In no distress.  Skin warm and dry. Eye:  No conjunctival injection or drainage. Lids were normal. ENT:  TMs and canals were normal, without erythema or inflammation.  Nasal mucosa was clear and uncongested, without drainage.  Mucous membranes were moist.  Pharynx was clear with no exudate or drainage.  There were no oral ulcerations or lesions. Neck:  Supple, no adenopathy, tenderness or  mass. Lungs:  No respiratory distress. She still has scattered expiratory wheezes, no rales, good air movement.  Heart:  Regular rhythm, without gallops, murmers or rubs. Skin:  Clear, warm, and dry, without rash or lesions.  Assessment     The encounter diagnosis was Community acquired pneumonia.  She feels a lot better. Her blood pressure is improved. When she was here last she had a hypotensive episode and required IV normal saline. She was encouraged to finish up both prescriptions of antibiotics. She will return again in 2 weeks for repeat chest x-ray.  Plan    1.  Meds:  The following meds were prescribed:   New Prescriptions   No medications on file    2.  Patient Education/Counseling:  The patient was given appropriate handouts, self care instructions, and instructed in symptomatic relief.  Instructed to get extra fluids, rest, and use a cool mist vaporizer.    3.  Follow up:  The patient was told to follow up here in 2 weeks for a repeat chest x-ray, or sooner if becoming worse in any way, and given some red flag symptoms such as increasing fever, difficulty breathing, chest pain, or persistent vomiting which would prompt immediate return.      Harden Mo, MD 09/02/13 [phone]

## 2013-09-16 ENCOUNTER — Emergency Department (INDEPENDENT_AMBULATORY_CARE_PROVIDER_SITE_OTHER): Payer: BC Managed Care – PPO

## 2013-09-16 ENCOUNTER — Emergency Department (HOSPITAL_COMMUNITY)
Admission: EM | Admit: 2013-09-16 | Discharge: 2013-09-16 | Disposition: A | Discharge status: Home / Self Care | Payer: BC Managed Care – PPO | Source: Home / Self Care | Attending: Emergency Medicine | Admitting: Emergency Medicine

## 2013-09-16 ENCOUNTER — Encounter (HOSPITAL_COMMUNITY): Payer: Self-pay | Admitting: Emergency Medicine

## 2013-09-16 DIAGNOSIS — R059 Cough, unspecified: Secondary | ICD-10-CM

## 2013-09-16 DIAGNOSIS — R05 Cough: Secondary | ICD-10-CM

## 2013-09-16 DIAGNOSIS — J189 Pneumonia, unspecified organism: Secondary | ICD-10-CM

## 2013-09-16 MED ORDER — GUAIFENESIN-CODEINE 100-10 MG/5ML PO SYRP: 5.0000 mL | ORAL_SOLUTION | Freq: Three times a day (TID) | ORAL | Status: DC | PRN

## 2013-09-16 NOTE — Discharge Instructions (Signed)

## 2013-09-16 NOTE — ED Notes (Signed)
Pt is here for persistent cough onset 2-3 days [facility] for similar sxs For the most part, cough is dry; occasionally will spit up some sputum Reports she feels much better; completed course of antibiotics Denies f/v/n/d Alert w/no signs of acute distress.

## 2013-09-16 NOTE — ED Provider Notes (Signed)
Chief Complaint   Chief Complaint  Patient presents with  . Cough    History of Present Illness   Evelyn Nelson is a 63 year old female who returns today for a followup chest x-ray a cousin pneumonia. She's finished up her antibiotics. She felt completely well for the last week although for the past 2 days she's had a slight dry cough. She denies any fever, chills, wheezing, chest pain, or shortness of breath. She's had no nasal congestion, rhinorrhea, postnasal drip, or sore throat.  Review of Systems   Other than as noted above, the patient denies any of the following symptoms: Systemic:  No fevers, chills, sweats, or myalgias. Eye:  No redness or discharge. ENT:  No ear pain, headache, nasal congestion, drainage, sinus pressure, or sore throat. Neck:  No neck pain, stiffness, or swollen glands. Lungs:  No cough, sputum production, hemoptysis, wheezing, chest tightness, shortness of breath or chest pain. GI:  No abdominal pain, nausea, vomiting or diarrhea.  [place]   Past medical history, family history, social history, meds, and allergies were reviewed.   Physical exam   Vital signs:  BP 132/56  Pulse 65  Temp(Src) 97.6 F (36.4 C) (Oral)  Resp 18  SpO2 100% General:  Alert and oriented.  In no distress.  Skin warm and dry. Eye:  No conjunctival injection or drainage. Lids were normal. ENT:  TMs and canals were normal, without erythema or inflammation.  Nasal mucosa was clear and uncongested, without drainage.  Mucous membranes were moist.  Pharynx was clear with no exudate but there was a small amount of white posterior pharynx drainage.  There were no oral ulcerations or lesions. Neck:  Supple, no adenopathy, tenderness or mass. Lungs:  No respiratory distress.  Lungs were clear to auscultation, without wheezes, rales or rhonchi.  Breath sounds were clear and equal bilaterally.  Heart:  Regular rhythm, without gallops, murmers or rubs. Skin:  Clear, warm, and dry,  without rash or lesions.  Radiology   Dg Chest 2 View  09/16/2013   CLINICAL DATA:  Followup pneumonia.  EXAM: CHEST  2 VIEW  COMPARISON:  DG CHEST 2 VIEW dated 08/29/2013; DG CHEST 2 VIEW dated 11/20/2008 .  FINDINGS: Stable cardiac and mediastinal contours. Mild tortuosity thoracic aorta. Previously described consolidative opacity within the right made hemi thorax has resolved. No large consolidative pulmonary opacity. No pleural effusion or pneumothorax. Regional skeleton is grossly unremarkable.  IMPRESSION: No acute cardiopulmonary process.   Electronically Signed   By: Lovey Newcomer M.D.   On: 09/16/2013 11:12   Assessment     The primary encounter diagnosis was Community acquired pneumonia. A diagnosis of Cough was also pertinent to this visit.  Her pneumonia is completely resolved on chest x-ray and clinically. I believe her persistent cough is due to postnasal drip from allergic rhinitis. I recommended over-the-counter antihistamines and she was provided with a prescription for cough syrup as well.  Plan    1.  Meds:  The following meds were prescribed:   New Prescriptions   GUAIFENESIN-CODEINE (GUAIATUSSIN AC) 100-10 MG/5ML SYRUP    Take 5 mLs by mouth 3 (three) times daily as needed for cough.    2.  Patient Education/Counseling:  The patient was given appropriate handouts, self care instructions, and instructed in symptomatic relief.  Instructed to get extra fluids, rest, and use a cool mist vaporizer.    3.  Follow up:  The patient was told to follow up here if no better  in 3 to 4 days, or sooner if becoming worse in any way, and given some red flag symptoms such as increasing fever, difficulty breathing, chest pain, or persistent vomiting which would prompt immediate return.  Follow up here as needed.      Harden Mo, MD 09/16/13 [phone]

## 2014-02-23 ENCOUNTER — Other Ambulatory Visit: Payer: Self-pay | Admitting: Women's Health

## 2014-02-23 DIAGNOSIS — Z1231 Encounter for screening mammogram for malignant neoplasm of breast: Secondary | ICD-10-CM

## 2014-04-23 ENCOUNTER — Encounter (HOSPITAL_COMMUNITY): Payer: Self-pay | Admitting: Emergency Medicine

## 2014-05-14 ENCOUNTER — Ambulatory Visit (HOSPITAL_COMMUNITY)
Admission: RE | Admit: 2014-05-14 | Discharge: 2014-05-14 | Disposition: A | Discharge status: Home / Self Care | Payer: BC Managed Care – PPO | Source: Ambulatory Visit | Attending: Women's Health | Admitting: Women's Health

## 2014-05-14 ENCOUNTER — Ambulatory Visit (INDEPENDENT_AMBULATORY_CARE_PROVIDER_SITE_OTHER): Payer: BC Managed Care – PPO | Admitting: Women's Health

## 2014-05-14 ENCOUNTER — Encounter: Payer: Self-pay | Admitting: Women's Health

## 2014-05-14 VITALS — BP 118/80 | Ht 66.0 in | Wt 155.0 lb

## 2014-05-14 DIAGNOSIS — Z13220 Encounter for screening for lipoid disorders: Secondary | ICD-10-CM

## 2014-05-14 DIAGNOSIS — Z1231 Encounter for screening mammogram for malignant neoplasm of breast: Secondary | ICD-10-CM | POA: Insufficient documentation

## 2014-05-14 DIAGNOSIS — Z01419 Encounter for gynecological examination (general) (routine) without abnormal findings: Secondary | ICD-10-CM

## 2014-05-14 DIAGNOSIS — M8580 Other specified disorders of bone density and structure, unspecified site: Secondary | ICD-10-CM

## 2014-05-14 LAB — CBC WITH DIFFERENTIAL/PLATELET
Basophils Absolute: 0.1 10*3/uL (ref 0.0–0.1)
Basophils Relative: 1 % (ref 0–1)
Eosinophils Absolute: 0.3 10*3/uL (ref 0.0–0.7)
Eosinophils Relative: 5 % (ref 0–5)
HCT: 36.1 % (ref 36.0–46.0)
Hemoglobin: 12.4 g/dL (ref 12.0–15.0)
Lymphocytes Relative: 29 % (ref 12–46)
Lymphs Abs: 1.8 10*3/uL (ref 0.7–4.0)
MCH: 30.8 pg (ref 26.0–34.0)
MCHC: 34.3 g/dL (ref 30.0–36.0)
MCV: 89.8 fL (ref 78.0–100.0)
MPV: 7.7 fL — ABNORMAL LOW (ref 9.4–12.4)
Monocytes Absolute: 0.7 10*3/uL (ref 0.1–1.0)
Monocytes Relative: 11 % (ref 3–12)
Neutro Abs: 3.4 10*3/uL (ref 1.7–7.7)
Neutrophils Relative %: 54 % (ref 43–77)
Platelets: 329 10*3/uL (ref 150–400)
RBC: 4.02 MIL/uL (ref 3.87–5.11)
RDW: 13.1 % (ref 11.5–15.5)
WBC: 6.3 10*3/uL (ref 4.0–10.5)

## 2014-05-14 LAB — COMPREHENSIVE METABOLIC PANEL
ALT: 11 U/L (ref 0–35)
AST: 20 U/L (ref 0–37)
Albumin: 4.6 g/dL (ref 3.5–5.2)
Alkaline Phosphatase: 60 U/L (ref 39–117)
BUN: 8 mg/dL (ref 6–23)
CO2: 27 mEq/L (ref 19–32)
Calcium: 9.8 mg/dL (ref 8.4–10.5)
Chloride: 95 mEq/L — ABNORMAL LOW (ref 96–112)
Creat: 0.62 mg/dL (ref 0.50–1.10)
Glucose, Bld: 93 mg/dL (ref 70–99)
Potassium: 5.4 mEq/L — ABNORMAL HIGH (ref 3.5–5.3)
Sodium: 133 mEq/L — ABNORMAL LOW (ref 135–145)
Total Bilirubin: 0.5 mg/dL (ref 0.2–1.2)
Total Protein: 6.3 g/dL (ref 6.0–8.3)

## 2014-05-14 LAB — LIPID PANEL
Cholesterol: 224 mg/dL — ABNORMAL HIGH (ref 0–200)
HDL: 117 mg/dL (ref >39)
LDL Cholesterol: 95 mg/dL (ref 0–99)
Total CHOL/HDL Ratio: 1.9 Ratio
Triglycerides: 60 mg/dL (ref <150)
VLDL: 12 mg/dL (ref 0–40)

## 2014-05-14 NOTE — Progress Notes (Signed)
Evelyn Nelson Jun 02, 1951 [id number]    History:    Presents for annual exam.  Postmenopausal/no bleeding/no HRT/not sexually active. Normal Pap and mammogram history. 2014 osteopenia T score -1.8 FRAX 30%/1.2% Dr. Phineas Real recalculated at 19%/1.2% fracture was after fall on cement. Vitamin D level normal at 51. Has not had a colonoscopy. Had Zostavax. Community acquired pneumonia 08/2013.   Past medical history, past surgical history, family history and social history were all reviewed and documented in the EPIC chart. Contemplating retirement at 64. Son history of alcohol abuse sober 3 years doing well. Father lung cancer.  ROS:  A  12 point ROS was performed and pertinent positives and negatives are included.  Exam:  Filed Vitals:   05/14/14 0939  BP: 118/80    General appearance:  Normal Thyroid:  Symmetrical, normal in size, without palpable masses or nodularity. Respiratory  Auscultation:  Clear without wheezing or rhonchi Cardiovascular  Auscultation:  Regular rate, without rubs, murmurs or gallops  Edema/varicosities:  Not grossly evident Abdominal  Soft,nontender, without masses, guarding or rebound.  Liver/spleen:  No organomegaly noted  Hernia:  None appreciated  Skin  Inspection:  Grossly normal   Breasts: Examined lying and sitting.     Right: Without masses, retractions, discharge or axillary adenopathy.     Left: Without masses, retractions, discharge or axillary adenopathy. Gentitourinary   Inguinal/mons:  Normal without inguinal adenopathy  External genitalia:  Normal  BUS/Urethra/Skene's glands:  Normal  Vagina:  Normal  Cervix:  Normal  Uterus:   normal in size, shape and contour.  Midline and mobile  Adnexa/parametria:     Rt: Without masses or tenderness.   Lt: Without masses or tenderness.  Anus and perineum: Normal  Digital rectal exam: Normal sphincter tone without palpated masses or tenderness  Assessment/Plan:  63 y.o. DWF G2P2 for annual  exam.  Right leg pain has follow-up with chiropractor Postmenopausal /no HRT/no bleeding Osteopenia without elevated FRAX Community acquired pneumonia 08/2013  Plan: Reviewed importance of screening colonoscopy, [facility] information given and reviewed importance of scheduling. SBE's, continue annual screening mammogram, scheduled today, calcium rich diet, regular exercise and continue vitamin D 1000 daily. Vitamin D 51 in 2014. Pneumovax at age 1. CBC, lipid panel, glucose, UA, Pap normal 2014, new screening guidelines reviewed. Pneumovax recommended.  Huel Cote [facility], 10:35 AM 05/14/2014

## 2014-05-14 NOTE — Patient Instructions (Signed)
Health Recommendations for Postmenopausal Women Respected and ongoing research has looked at the most common causes of death, disability, and poor quality of life in postmenopausal women. The causes include heart disease, diseases of blood vessels, diabetes, depression, cancer, and bone loss (osteoporosis). Many things can be done to help lower the chances of developing these and other common problems. CARDIOVASCULAR DISEASE Heart Disease: A heart attack is a medical emergency. Know the signs and symptoms of a heart attack. Below are things women can do to reduce their risk for heart disease.   Do not smoke. If you smoke, quit.  Aim for a healthy weight. Being overweight causes many preventable deaths. Eat a healthy and balanced diet and drink an adequate amount of liquids.  Get moving. Make a commitment to be more physically active. Aim for 30 minutes of activity on most, if not all days of the week.  Eat for heart health. Choose a diet that is low in saturated fat and cholesterol and eliminate trans fat. Include whole grains, vegetables, and fruits. Read and understand the labels on food containers before buying.  Know your numbers. Ask your caregiver to check your blood pressure, cholesterol (total, HDL, LDL, triglycerides) and blood glucose. Work with your caregiver on improving your entire clinical picture.  High blood pressure. Limit or stop your table salt intake (try salt substitute and food seasonings). Avoid salty foods and drinks. Read labels on food containers before buying. Eating well and exercising can help control high blood pressure. STROKE  Stroke is a medical emergency. Stroke may be the result of a blood clot in a blood vessel in the brain or by a brain hemorrhage (bleeding). Know the signs and symptoms of a stroke. To lower the risk of developing a stroke:  Avoid fatty foods.  Quit smoking.  Control your diabetes, blood pressure, and irregular heart rate. THROMBOPHLEBITIS  (BLOOD CLOT) OF THE LEG  Becoming overweight and leading a stationary lifestyle may also contribute to developing blood clots. Controlling your diet and exercising will help lower the risk of developing blood clots. CANCER SCREENING  Breast Cancer: Take steps to reduce your risk of breast cancer.  You should practice "breast self-awareness." This means understanding the normal appearance and feel of your breasts and should include breast self-examination. Any changes detected, no matter how small, should be reported to your caregiver.  After age 40, you should have a clinical breast exam (CBE) every year.  Starting at age 40, you should consider having a mammogram (breast X-ray) every year.  If you have a family history of breast cancer, talk to your caregiver about genetic screening.  If you are at high risk for breast cancer, talk to your caregiver about having an MRI and a mammogram every year.  Intestinal or Stomach Cancer: Tests to consider are a rectal exam, fecal occult blood, sigmoidoscopy, and colonoscopy. Women who are high risk may need to be screened at an earlier age and more often.  Cervical Cancer:  Beginning at age 30, you should have a Pap test every 3 years as long as the past 3 Pap tests have been normal.  If you have had past treatment for cervical cancer or a condition that could lead to cancer, you need Pap tests and screening for cancer for at least 20 years after your treatment.  If you had a hysterectomy for a problem that was not cancer or a condition that could lead to cancer, then you no longer need Pap tests.    If you are between ages 65 and 70, and you have had normal Pap tests going back 10 years, you no longer need Pap tests.  If Pap tests have been discontinued, risk factors (such as a new sexual partner) need to be reassessed to determine if screening should be resumed.  Some medical problems can increase the chance of getting cervical cancer. In these  cases, your caregiver may recommend more frequent screening and Pap tests.  Uterine Cancer: If you have vaginal bleeding after reaching menopause, you should notify your caregiver.  Ovarian Cancer: Other than yearly pelvic exams, there are no reliable tests available to screen for ovarian cancer at this time except for yearly pelvic exams.  Lung Cancer: Yearly chest X-rays can detect lung cancer and should be done on high risk women, such as cigarette smokers and women with chronic lung disease (emphysema).  Skin Cancer: A complete body skin exam should be done at your yearly examination. Avoid overexposure to the sun and ultraviolet light lamps. Use a strong sun block cream when in the sun. All of these things are important for lowering the risk of skin cancer. MENOPAUSE Menopause Symptoms: Hormone therapy products are effective for treating symptoms associated with menopause:  Moderate to severe hot flashes.  Night sweats.  Mood swings.  Headaches.  Tiredness.  Loss of sex drive.  Insomnia.  Other symptoms. Hormone replacement carries certain risks, especially in older women. Women who use or are thinking about using estrogen or estrogen with progestin treatments should discuss that with their caregiver. Your caregiver will help you understand the benefits and risks. The ideal dose of hormone replacement therapy is not known. The Food and Drug Administration (FDA) has concluded that hormone therapy should be used only at the lowest doses and for the shortest amount of time to reach treatment goals.  OSTEOPOROSIS Protecting Against Bone Loss and Preventing Fracture If you use hormone therapy for prevention of bone loss (osteoporosis), the risks for bone loss must outweigh the risk of the therapy. Ask your caregiver about other medications known to be safe and effective for preventing bone loss and fractures. To guard against bone loss or fractures, the following is recommended:  If  you are younger than age 50, take 1000 mg of calcium and at least 600 mg of Vitamin D per day.  If you are older than age 50 but younger than age 70, take 1200 mg of calcium and at least 600 mg of Vitamin D per day.  If you are older than age 70, take 1200 mg of calcium and at least 800 mg of Vitamin D per day. Smoking and excessive alcohol intake increases the risk of osteoporosis. Eat foods rich in calcium and vitamin D and do weight bearing exercises several times a week as your caregiver suggests. DIABETES Diabetes Mellitus: If you have type I or type 2 diabetes, you should keep your blood sugar under control with diet, exercise, and recommended medication. Avoid starchy and fatty foods, and too many sweets. Being overweight can make diabetes control more difficult. COGNITION AND MEMORY Cognition and Memory: Menopausal hormone therapy is not recommended for the prevention of cognitive disorders such as Alzheimer's disease or memory loss.  DEPRESSION  Depression may occur at any age, but it is common in elderly women. This may be because of physical, medical, social (loneliness), or financial problems and needs. If you are experiencing depression because of medical problems and control of symptoms, talk to your caregiver about this. Physical   activity and exercise may help with mood and sleep. Community and volunteer involvement may improve your sense of value and worth. If you have depression and you feel that the problem is getting worse or becoming severe, talk to your caregiver about which treatment options are best for you. ACCIDENTS  Accidents are common and can be serious in elderly woman. Prepare your house to prevent accidents. Eliminate throw rugs, place hand bars in bath, shower, and toilet areas. Avoid wearing high heeled shoes or walking on wet, snowy, and icy areas. Limit or stop driving if you have vision or hearing problems, or if you feel you are unsteady with your movements and  reflexes. HEPATITIS C Hepatitis C is a type of viral infection affecting the liver. It is spread mainly through contact with blood from an infected person. It can be treated, but if left untreated, it can lead to severe liver damage over the years. Many people who are infected do not know that the virus is in their blood. If you are a "baby-boomer", it is recommended that you have one screening test for Hepatitis C. IMMUNIZATIONS  Several immunizations are important to consider having during your senior years, including:   Tetanus, diphtheria, and pertussis booster shot.  Influenza every year before the flu season begins.  Pneumonia vaccine.  Shingles vaccine.  Others, as indicated based on your specific needs. Talk to your caregiver about these. Document Released: 07/31/2005 Document Revised: 10/23/2013 Document Reviewed: 03/26/2008 ExitCare Patient Information 2015 [employer]. This information is not intended to replace advice given to you by your health care provider. Make sure you discuss any questions you have with your health care provider.  

## 2014-05-15 ENCOUNTER — Other Ambulatory Visit: Payer: Self-pay | Admitting: Women's Health

## 2014-05-15 DIAGNOSIS — E870 Hyperosmolality and hypernatremia: Secondary | ICD-10-CM

## 2014-05-15 LAB — URINALYSIS W MICROSCOPIC + REFLEX CULTURE
Bacteria, UA: NONE SEEN
Bilirubin Urine: NEGATIVE
Casts: NONE SEEN
Crystals: NONE SEEN
Glucose, UA: NEGATIVE mg/dL
Hgb urine dipstick: NEGATIVE
Ketones, ur: NEGATIVE mg/dL
Leukocytes, UA: NEGATIVE
Nitrite: NEGATIVE
Protein, ur: NEGATIVE mg/dL
Specific Gravity, Urine: 1.014 (ref 1.005–1.030)
Squamous Epithelial / LPF: NONE SEEN
Urobilinogen, UA: 0.2 mg/dL (ref 0.0–1.0)
pH: 7 (ref 5.0–8.0)

## 2015-03-25 ENCOUNTER — Other Ambulatory Visit (HOSPITAL_COMMUNITY): Payer: Self-pay

## 2015-03-27 ENCOUNTER — Encounter (HOSPITAL_COMMUNITY)
Admission: RE | Admit: 2015-03-27 | Discharge: 2015-03-27 | Disposition: A | Discharge status: Home / Self Care | Payer: BLUE CROSS/BLUE SHIELD | Source: Ambulatory Visit | Attending: Orthopaedic Surgery | Admitting: Orthopaedic Surgery

## 2015-03-27 ENCOUNTER — Encounter (HOSPITAL_COMMUNITY): Payer: Self-pay

## 2015-03-27 DIAGNOSIS — R55 Syncope and collapse: Secondary | ICD-10-CM | POA: Diagnosis not present

## 2015-03-27 DIAGNOSIS — M1611 Unilateral primary osteoarthritis, right hip: Secondary | ICD-10-CM | POA: Diagnosis not present

## 2015-03-27 DIAGNOSIS — Z01812 Encounter for preprocedural laboratory examination: Secondary | ICD-10-CM | POA: Insufficient documentation

## 2015-03-27 DIAGNOSIS — Z01818 Encounter for other preprocedural examination: Secondary | ICD-10-CM | POA: Diagnosis not present

## 2015-03-27 HISTORY — DX: Unspecified osteoarthritis, unspecified site: M19.90

## 2015-03-27 HISTORY — DX: Other specified postprocedural states: Z98.890

## 2015-03-27 HISTORY — DX: Other complications of anesthesia, initial encounter: T88.59XA

## 2015-03-27 HISTORY — DX: Adverse effect of unspecified anesthetic, initial encounter: T41.45XA

## 2015-03-27 HISTORY — DX: Nausea with vomiting, unspecified: R11.2

## 2015-03-27 LAB — SURGICAL PCR SCREEN
MRSA, PCR: NEGATIVE
Staphylococcus aureus: NEGATIVE

## 2015-03-27 LAB — BASIC METABOLIC PANEL
Anion gap: 9 (ref 5–15)
BUN: 9 mg/dL (ref 6–20)
CO2: 27 mmol/L (ref 22–32)
Calcium: 9.1 mg/dL (ref 8.9–10.3)
Chloride: 90 mmol/L — ABNORMAL LOW (ref 101–111)
Creatinine, Ser: 0.64 mg/dL (ref 0.44–1.00)
GFR calc Af Amer: >60 mL/min (ref >60)
GFR calc non Af Amer: >60 mL/min (ref >60)
Glucose, Bld: 97 mg/dL (ref 65–99)
Potassium: 4.5 mmol/L (ref 3.5–5.1)
Sodium: 126 mmol/L — ABNORMAL LOW (ref 135–145)

## 2015-03-27 LAB — CBC
HCT: 33.6 % — ABNORMAL LOW (ref 36.0–46.0)
Hemoglobin: 11.6 g/dL — ABNORMAL LOW (ref 12.0–15.0)
MCH: 31.4 pg (ref 26.0–34.0)
MCHC: 34.5 g/dL (ref 30.0–36.0)
MCV: 91.1 fL (ref 78.0–100.0)
Platelets: 263 10*3/uL (ref 150–400)
RBC: 3.69 MIL/uL — ABNORMAL LOW (ref 3.87–5.11)
RDW: 13.3 % (ref 11.5–15.5)
WBC: 6.4 10*3/uL (ref 4.0–10.5)

## 2015-03-27 NOTE — Pre-Procedure Instructions (Signed)
Evelyn Nelson  03/27/2015      [id number] [place] [place] [name] [address] Phone: [phone] Fax: [place] [place] [address] Phone: [phone] Fax: [phone]    Your procedure is scheduled on 04/09/2015.  Report to [facility] Admitting at 1:30 P.M.  Call this number if you have problems the morning of surgery:  [phone]   Remember:  Do not eat food or drink liquids after midnight.  On Monday   Take these medicines the morning of surgery with A SIP OF WATER:  NONE               STOP IBUPROFEN & ASPIRIN as of 04/04/2015    Do not wear jewelry, make-up or nail polish.   Do not wear lotions, powders, or perfumes.  You may wear deodorant.    Do not shave 48 hours prior to surgery.    Do not bring valuables to the hospital.   [facility] is not responsible for any belongings or valuables.  Contacts, dentures or bridgework may not be worn into surgery.  Leave your suitcase in the car.  After surgery it may be brought to your room.  For patients admitted to the hospital, discharge time will be determined by your treatment team.  Patients discharged the day of surgery will not be allowed to drive home.   Name and phone number of your driver:   /w daughter   Special instructions:  Special Instructions: [address] - Preparing for Surgery  Before surgery, you can play an important role.  Because skin is not sterile, your skin needs to be as free of germs as possible.  You can reduce the number of germs on you skin by washing with CHG (chlorahexidine gluconate) soap before surgery.  CHG is an antiseptic cleaner which kills germs and bonds with the skin to continue killing germs even after washing.  Please DO NOT use if you have an allergy to CHG or antibacterial soaps.  If  your skin becomes reddened/irritated stop using the CHG and inform your nurse when you arrive at Short Stay.  Do not shave (including legs and underarms) for at least 48 hours prior to the first CHG shower.  You may shave your face.  Please follow these instructions carefully:   1.  Shower with CHG Soap the night before surgery and the  morning of Surgery.  2.  If you choose to wash your hair, wash your hair first as usual with your  normal shampoo.  3.  After you shampoo, rinse your hair and body thoroughly to remove the  Shampoo.  4.  Use CHG as you would any other liquid soap.  You can apply chg directly to the skin and wash gently with scrungie or a clean washcloth.  5.  Apply the CHG Soap to your body ONLY FROM THE NECK DOWN.    Do not use on open wounds or open sores.  Avoid contact with your eyes, ears, mouth and genitals (private parts).  Wash genitals (private parts)   with your normal soap.  6.  Wash thoroughly, paying special attention to the area where your surgery will be performed.  7.  Thoroughly rinse your body with warm water from the neck down.  8.  DO NOT shower/wash with your  normal soap after using and rinsing off   the CHG Soap.  9.  Pat yourself dry with a clean towel.            10.  Wear clean pajamas.            11.  Place clean sheets on your bed the night of your first shower and do not sleep with pets.  Day of Surgery  Do not apply any lotions/deodorants the morning of surgery.  Please wear clean clothes to the hospital/surgery center.  Please read over the following fact sheets that you were given. Pain Booklet, Coughing and Deep Breathing, Total Joint Packet, MRSA Information and Surgical Site Infection Prevention

## 2015-03-27 NOTE — Progress Notes (Signed)
Pt. Reports that she has had episodes of passing out & wore a Holter monitor at one time but never heard that there were any problems. Pt. Has episodes of care but doesn't actually have a PCP, sees Urgent care & NP at GYN office (/ what office, she is not sure). Order put in to do EKG today

## 2015-03-28 NOTE — Progress Notes (Addendum)
Anesthesia Chart Review:  Pt is 64 year old female scheduled for R total hip arthroplasty anterior approach on 04/09/2015 with Dr. Kathrynn Speed.   PMH includes: post-op N/V. Never smoker. BMI 24.   Medications include: ASA.   Preoperative labs reviewed.  Na 126, Cl 90.   EKG 03/27/2015: NSR.   Left voicemail message for Sherrie in Dr. Trevor Mace office- pt will need to get abnormal labs evaluated by PCP prior to surgery.   Willeen Cass, FNP-BC [facility] Short Stay Surgical Center/Anesthesiology Phone: [phone] 03/28/2015 1:53 PM  Addendum: Patient was seen by Dr. Daiva Eves at [facility] for a pre-operative evaluation. Patient had been restricting fluids. When BMET rechecked 04/01/15, Na had improved to 134. Cl up to 94. K 4.5. Cr normal at 0.62. Subsequently, Dr. Lisbeth Ply medically cleared patient for surgery.  George Hugh [facility] Short Stay Center/Anesthesiology Phone [phone] 04/03/2015 5:17 PM

## 2015-04-08 MED ORDER — SODIUM CHLORIDE 0.9 % IV SOLN
1000.0000 mg | INTRAVENOUS | Status: AC
  Filled 2015-04-08: qty 10

## 2015-04-08 NOTE — Progress Notes (Signed)
Pt called about time change. Instructed to be here at 1200 for 1400 surgery time. Voices understanding.

## 2015-04-09 ENCOUNTER — Inpatient Hospital Stay (HOSPITAL_COMMUNITY): Payer: BLUE CROSS/BLUE SHIELD

## 2015-04-09 ENCOUNTER — Inpatient Hospital Stay (HOSPITAL_COMMUNITY): Payer: BLUE CROSS/BLUE SHIELD | Admitting: Emergency Medicine

## 2015-04-09 ENCOUNTER — Encounter (HOSPITAL_COMMUNITY): Payer: Self-pay | Admitting: *Deleted

## 2015-04-09 ENCOUNTER — Encounter (HOSPITAL_COMMUNITY)
Admission: RE | Disposition: A | Discharge status: Home Health | Payer: Self-pay | Source: Ambulatory Visit | Attending: Orthopaedic Surgery

## 2015-04-09 ENCOUNTER — Inpatient Hospital Stay (HOSPITAL_COMMUNITY)
Admission: RE | Admit: 2015-04-09 | Discharge: 2015-04-12 | DRG: 470 | Disposition: A | Discharge status: Home Health | Payer: BLUE CROSS/BLUE SHIELD | Source: Ambulatory Visit | Attending: Orthopaedic Surgery | Admitting: Orthopaedic Surgery

## 2015-04-09 ENCOUNTER — Inpatient Hospital Stay (HOSPITAL_COMMUNITY): Payer: BLUE CROSS/BLUE SHIELD | Admitting: Anesthesiology

## 2015-04-09 DIAGNOSIS — Z7982 Long term (current) use of aspirin: Secondary | ICD-10-CM

## 2015-04-09 DIAGNOSIS — M8580 Other specified disorders of bone density and structure, unspecified site: Secondary | ICD-10-CM | POA: Diagnosis present

## 2015-04-09 DIAGNOSIS — D62 Acute posthemorrhagic anemia: Secondary | ICD-10-CM | POA: Diagnosis not present

## 2015-04-09 DIAGNOSIS — M1611 Unilateral primary osteoarthritis, right hip: Principal | ICD-10-CM

## 2015-04-09 DIAGNOSIS — M25751 Osteophyte, right hip: Secondary | ICD-10-CM | POA: Diagnosis present

## 2015-04-09 DIAGNOSIS — Z419 Encounter for procedure for purposes other than remedying health state, unspecified: Secondary | ICD-10-CM

## 2015-04-09 DIAGNOSIS — Z96641 Presence of right artificial hip joint: Secondary | ICD-10-CM

## 2015-04-09 HISTORY — PX: TOTAL HIP ARTHROPLASTY: SHX124

## 2015-04-09 SURGERY — ARTHROPLASTY, HIP, TOTAL, ANTERIOR APPROACH
Anesthesia: Monitor Anesthesia Care | Site: Hip | Laterality: Right

## 2015-04-09 MED ORDER — CEFAZOLIN SODIUM-DEXTROSE 2-3 GM-% IV SOLR
2.0000 g | INTRAVENOUS | Status: AC
  Administered 2015-04-09: 2 g via INTRAVENOUS

## 2015-04-09 MED ORDER — TRANEXAMIC ACID 1000 MG/10ML IV SOLN
1000.0000 mg | INTRAVENOUS | Status: AC
  Administered 2015-04-09: 1000 mg via INTRAVENOUS
  Filled 2015-04-09: qty 10

## 2015-04-09 MED ORDER — OXYCODONE HCL 5 MG/5ML PO SOLN
5.0000 mg | Freq: Once | ORAL | Status: AC | PRN
Start: 2015-04-09 — End: 2015-04-09

## 2015-04-09 MED ORDER — PHENOL 1.4 % MT LIQD: 1.0000 | OROMUCOSAL | Status: DC | PRN

## 2015-04-09 MED ORDER — ASPIRIN EC 325 MG PO TBEC
325.0000 mg | DELAYED_RELEASE_TABLET | Freq: Two times a day (BID) | ORAL | Status: DC
  Administered 2015-04-10 – 2015-04-12 (×6): 325 mg via ORAL
  Filled 2015-04-09 (×6): qty 1

## 2015-04-09 MED ORDER — SODIUM CHLORIDE 0.9 % IJ SOLN
INTRAMUSCULAR | Status: AC
  Filled 2015-04-09: qty 10

## 2015-04-09 MED ORDER — FENTANYL CITRATE (PF) 250 MCG/5ML IJ SOLN
INTRAMUSCULAR | Status: AC
  Filled 2015-04-09: qty 5

## 2015-04-09 MED ORDER — SUCCINYLCHOLINE CHLORIDE 20 MG/ML IJ SOLN
INTRAMUSCULAR | Status: AC
  Filled 2015-04-09: qty 1

## 2015-04-09 MED ORDER — PROPOFOL 10 MG/ML IV BOLUS
INTRAVENOUS | Status: DC | PRN
  Administered 2015-04-09: 10 mg via INTRAVENOUS

## 2015-04-09 MED ORDER — PHENYLEPHRINE HCL 10 MG/ML IJ SOLN
INTRAMUSCULAR | Status: DC | PRN
  Administered 2015-04-09 (×3): 80 ug via INTRAVENOUS

## 2015-04-09 MED ORDER — LACTATED RINGERS IV SOLN
INTRAVENOUS | Status: DC
  Administered 2015-04-09: 13:00:00 via INTRAVENOUS

## 2015-04-09 MED ORDER — 0.9 % SODIUM CHLORIDE (POUR BTL) OPTIME
TOPICAL | Status: DC | PRN
  Administered 2015-04-09: 1000 mL

## 2015-04-09 MED ORDER — DIPHENHYDRAMINE HCL 12.5 MG/5ML PO ELIX: 12.5000 mg | ORAL_SOLUTION | ORAL | Status: DC | PRN

## 2015-04-09 MED ORDER — METHOCARBAMOL 500 MG PO TABS
ORAL_TABLET | ORAL | Status: AC
  Filled 2015-04-09: qty 1

## 2015-04-09 MED ORDER — ONDANSETRON HCL 4 MG/2ML IJ SOLN
4.0000 mg | Freq: Four times a day (QID) | INTRAMUSCULAR | Status: DC | PRN
  Administered 2015-04-09 – 2015-04-10 (×2): 4 mg via INTRAVENOUS
  Filled 2015-04-09 (×2): qty 2

## 2015-04-09 MED ORDER — SODIUM CHLORIDE 0.9 % IR SOLN
Status: DC | PRN
  Administered 2015-04-09: 3000 mL

## 2015-04-09 MED ORDER — FENTANYL CITRATE (PF) 100 MCG/2ML IJ SOLN
INTRAMUSCULAR | Status: DC | PRN
  Administered 2015-04-09: 50 ug via INTRAVENOUS

## 2015-04-09 MED ORDER — HYDROMORPHONE HCL 1 MG/ML IJ SOLN
0.2500 mg | INTRAMUSCULAR | Status: DC | PRN
  Administered 2015-04-09 (×2): 0.5 mg via INTRAVENOUS

## 2015-04-09 MED ORDER — EPHEDRINE SULFATE 50 MG/ML IJ SOLN
INTRAMUSCULAR | Status: DC | PRN
  Administered 2015-04-09: 5 mg via INTRAVENOUS
  Administered 2015-04-09 (×2): 10 mg via INTRAVENOUS

## 2015-04-09 MED ORDER — MIDAZOLAM HCL 5 MG/5ML IJ SOLN
INTRAMUSCULAR | Status: DC | PRN
  Administered 2015-04-09: 1 mg via INTRAVENOUS

## 2015-04-09 MED ORDER — BUPIVACAINE HCL (PF) 0.5 % IJ SOLN
INTRAMUSCULAR | Status: DC | PRN
  Administered 2015-04-09: 3 mL via INTRATHECAL

## 2015-04-09 MED ORDER — OXYCODONE HCL 5 MG PO TABS
5.0000 mg | ORAL_TABLET | ORAL | Status: DC | PRN
  Administered 2015-04-09: 10 mg via ORAL
  Administered 2015-04-10 (×2): 5 mg via ORAL
  Filled 2015-04-09: qty 2
  Filled 2015-04-09 (×3): qty 1

## 2015-04-09 MED ORDER — LIDOCAINE HCL (CARDIAC) 20 MG/ML IV SOLN
INTRAVENOUS | Status: AC
  Filled 2015-04-09: qty 5

## 2015-04-09 MED ORDER — ONDANSETRON HCL 4 MG PO TABS
4.0000 mg | ORAL_TABLET | Freq: Four times a day (QID) | ORAL | Status: DC | PRN
  Administered 2015-04-10: 4 mg via ORAL
  Filled 2015-04-09: qty 1

## 2015-04-09 MED ORDER — PROPOFOL 10 MG/ML IV BOLUS
INTRAVENOUS | Status: AC
  Filled 2015-04-09: qty 20

## 2015-04-09 MED ORDER — LACTATED RINGERS IV SOLN
INTRAVENOUS | Status: DC | PRN
  Administered 2015-04-09 (×3): via INTRAVENOUS

## 2015-04-09 MED ORDER — CEFAZOLIN SODIUM-DEXTROSE 2-3 GM-% IV SOLR
INTRAVENOUS | Status: AC
  Filled 2015-04-09: qty 50

## 2015-04-09 MED ORDER — EPHEDRINE SULFATE 50 MG/ML IJ SOLN
INTRAMUSCULAR | Status: AC
  Filled 2015-04-09: qty 1

## 2015-04-09 MED ORDER — OXYCODONE HCL 5 MG PO TABS
5.0000 mg | ORAL_TABLET | Freq: Once | ORAL | Status: AC | PRN
  Administered 2015-04-09: 5 mg via ORAL

## 2015-04-09 MED ORDER — CEFAZOLIN SODIUM 1-5 GM-% IV SOLN
1.0000 g | Freq: Four times a day (QID) | INTRAVENOUS | Status: AC
Start: 2015-04-09 — End: 2015-04-10
  Administered 2015-04-09 – 2015-04-10 (×2): 1 g via INTRAVENOUS
  Filled 2015-04-09 (×2): qty 50

## 2015-04-09 MED ORDER — ACETAMINOPHEN 325 MG PO TABS
650.0000 mg | ORAL_TABLET | Freq: Four times a day (QID) | ORAL | Status: DC | PRN
  Administered 2015-04-10 – 2015-04-12 (×7): 650 mg via ORAL
  Filled 2015-04-09 (×8): qty 2

## 2015-04-09 MED ORDER — MIDAZOLAM HCL 2 MG/2ML IJ SOLN
INTRAMUSCULAR | Status: AC
  Filled 2015-04-09: qty 4

## 2015-04-09 MED ORDER — HYDROMORPHONE HCL 1 MG/ML IJ SOLN
INTRAMUSCULAR | Status: AC
  Filled 2015-04-09: qty 2

## 2015-04-09 MED ORDER — MENTHOL 3 MG MT LOZG: 1.0000 | LOZENGE | OROMUCOSAL | Status: DC | PRN

## 2015-04-09 MED ORDER — METHOCARBAMOL 500 MG PO TABS
500.0000 mg | ORAL_TABLET | Freq: Four times a day (QID) | ORAL | Status: DC | PRN
  Administered 2015-04-09: 500 mg via ORAL
  Filled 2015-04-09: qty 1

## 2015-04-09 MED ORDER — HYDROMORPHONE HCL 1 MG/ML IJ SOLN
1.0000 mg | INTRAMUSCULAR | Status: DC | PRN
  Administered 2015-04-09: 1 mg via INTRAVENOUS
  Filled 2015-04-09: qty 1

## 2015-04-09 MED ORDER — DOCUSATE SODIUM 100 MG PO CAPS
100.0000 mg | ORAL_CAPSULE | Freq: Two times a day (BID) | ORAL | Status: DC
  Administered 2015-04-10 – 2015-04-12 (×6): 100 mg via ORAL
  Filled 2015-04-09 (×6): qty 1

## 2015-04-09 MED ORDER — ROCURONIUM BROMIDE 50 MG/5ML IV SOLN
INTRAVENOUS | Status: AC
  Filled 2015-04-09: qty 1

## 2015-04-09 MED ORDER — ONDANSETRON HCL 4 MG/2ML IJ SOLN
INTRAMUSCULAR | Status: AC
  Filled 2015-04-09: qty 2

## 2015-04-09 MED ORDER — SODIUM CHLORIDE 0.9 % IV SOLN
INTRAVENOUS | Status: DC
  Administered 2015-04-09 – 2015-04-10 (×2): via INTRAVENOUS

## 2015-04-09 MED ORDER — METOCLOPRAMIDE HCL 5 MG/ML IJ SOLN
5.0000 mg | Freq: Three times a day (TID) | INTRAMUSCULAR | Status: DC | PRN
  Administered 2015-04-10: 10 mg via INTRAVENOUS
  Filled 2015-04-09: qty 2

## 2015-04-09 MED ORDER — ZOLPIDEM TARTRATE 5 MG PO TABS: 5.0000 mg | ORAL_TABLET | Freq: Every evening | ORAL | Status: DC | PRN

## 2015-04-09 MED ORDER — ALUM & MAG HYDROXIDE-SIMETH 200-200-20 MG/5ML PO SUSP: 30.0000 mL | ORAL | Status: DC | PRN

## 2015-04-09 MED ORDER — ACETAMINOPHEN 650 MG RE SUPP: 650.0000 mg | Freq: Four times a day (QID) | RECTAL | Status: DC | PRN

## 2015-04-09 MED ORDER — OXYCODONE HCL 5 MG PO TABS
ORAL_TABLET | ORAL | Status: AC
  Filled 2015-04-09: qty 1

## 2015-04-09 MED ORDER — METHOCARBAMOL 1000 MG/10ML IJ SOLN
500.0000 mg | Freq: Four times a day (QID) | INTRAVENOUS | Status: DC | PRN
  Filled 2015-04-09: qty 5

## 2015-04-09 MED ORDER — PROPOFOL 500 MG/50ML IV EMUL
INTRAVENOUS | Status: DC | PRN
  Administered 2015-04-09: 75 ug/kg/min via INTRAVENOUS

## 2015-04-09 MED ORDER — METOCLOPRAMIDE HCL 5 MG PO TABS: 5.0000 mg | ORAL_TABLET | Freq: Three times a day (TID) | ORAL | Status: DC | PRN

## 2015-04-09 MED ORDER — PROMETHAZINE HCL 25 MG/ML IJ SOLN: 6.2500 mg | INTRAMUSCULAR | Status: DC | PRN

## 2015-04-09 SURGICAL SUPPLY — 51 items
APL SKNCLS STERI-STRIP NONHPOA (GAUZE/BANDAGES/DRESSINGS) ×1
BENZOIN TINCTURE PRP APPL 2/3 (GAUZE/BANDAGES/DRESSINGS) ×2 IMPLANT
BLADE SAW SGTL 18X1.27X75 (BLADE) ×2 IMPLANT
CAPT HIP TOTAL 2 ×2 IMPLANT
CELLS DAT CNTRL 66122 CELL SVR (MISCELLANEOUS) ×1 IMPLANT
CLSR STERI-STRIP ANTIMIC 1/2X4 (GAUZE/BANDAGES/DRESSINGS) ×2 IMPLANT
COVER SURGICAL LIGHT HANDLE (MISCELLANEOUS) ×2 IMPLANT
DRAPE C-ARM 42X72 X-RAY (DRAPES) ×2 IMPLANT
DRAPE STERI IOBAN 125X83 (DRAPES) ×2 IMPLANT
DRAPE U-SHAPE 47X51 STRL (DRAPES) ×6 IMPLANT
DRSG AQUACEL AG ADV 3.5X10 (GAUZE/BANDAGES/DRESSINGS) ×2 IMPLANT
DURAPREP 26ML APPLICATOR (WOUND CARE) ×2 IMPLANT
ELECT BLADE 4.0 EZ CLEAN MEGAD (MISCELLANEOUS) ×2
ELECT BLADE 6.5 EXT (BLADE) IMPLANT
ELECT REM PT RETURN 9FT ADLT (ELECTROSURGICAL) ×2
ELECTRODE BLDE 4.0 EZ CLN MEGD (MISCELLANEOUS) ×1 IMPLANT
ELECTRODE REM PT RTRN 9FT ADLT (ELECTROSURGICAL) ×1 IMPLANT
FACESHIELD WRAPAROUND (MASK) ×6 IMPLANT
GLOVE BIO SURGEON STRL SZ 6.5 (GLOVE) ×2 IMPLANT
GLOVE BIO SURGEON STRL SZ7 (GLOVE) ×6 IMPLANT
GLOVE BIOGEL PI IND STRL 8 (GLOVE) ×2 IMPLANT
GLOVE BIOGEL PI INDICATOR 8 (GLOVE) ×2
GLOVE ORTHO TXT STRL SZ7.5 (GLOVE) ×4 IMPLANT
GOWN STRL REUS W/ TWL LRG LVL3 (GOWN DISPOSABLE) ×2 IMPLANT
GOWN STRL REUS W/ TWL XL LVL3 (GOWN DISPOSABLE) ×2 IMPLANT
GOWN STRL REUS W/TWL LRG LVL3 (GOWN DISPOSABLE) ×4
GOWN STRL REUS W/TWL XL LVL3 (GOWN DISPOSABLE) ×4
HANDPIECE INTERPULSE COAX TIP (DISPOSABLE) ×2
KIT BASIN OR (CUSTOM PROCEDURE TRAY) ×2 IMPLANT
KIT ROOM TURNOVER OR (KITS) ×2 IMPLANT
MANIFOLD NEPTUNE II (INSTRUMENTS) ×2 IMPLANT
NS IRRIG 1000ML POUR BTL (IV SOLUTION) ×2 IMPLANT
PACK TOTAL JOINT (CUSTOM PROCEDURE TRAY) ×2 IMPLANT
PACK UNIVERSAL I (CUSTOM PROCEDURE TRAY) ×2 IMPLANT
PAD ARMBOARD 7.5X6 YLW CONV (MISCELLANEOUS) ×2 IMPLANT
RTRCTR WOUND ALEXIS 18CM MED (MISCELLANEOUS) ×2
SET HNDPC FAN SPRY TIP SCT (DISPOSABLE) ×1 IMPLANT
STAPLER VISISTAT 35W (STAPLE) IMPLANT
STRIP CLOSURE SKIN 1/2X4 (GAUZE/BANDAGES/DRESSINGS) ×4 IMPLANT
SUT ETHIBOND NAB CT1 #1 30IN (SUTURE) ×2 IMPLANT
SUT MNCRL AB 4-0 PS2 18 (SUTURE) IMPLANT
SUT VIC AB 0 CT1 27 (SUTURE) ×2
SUT VIC AB 0 CT1 27XBRD ANBCTR (SUTURE) ×1 IMPLANT
SUT VIC AB 1 CT1 27 (SUTURE) ×2
SUT VIC AB 1 CT1 27XBRD ANBCTR (SUTURE) ×1 IMPLANT
SUT VIC AB 2-0 CT1 27 (SUTURE) ×2
SUT VIC AB 2-0 CT1 TAPERPNT 27 (SUTURE) ×1 IMPLANT
TOWEL OR 17X24 6PK STRL BLUE (TOWEL DISPOSABLE) ×2 IMPLANT
TOWEL OR 17X26 10 PK STRL BLUE (TOWEL DISPOSABLE) ×2 IMPLANT
TRAY FOLEY CATH 16FRSI W/METER (SET/KITS/TRAYS/PACK) IMPLANT
WATER STERILE IRR 1000ML POUR (IV SOLUTION) ×4 IMPLANT

## 2015-04-09 NOTE — H&P (Signed)
TOTAL HIP ADMISSION H&P  Patient is admitted for right total hip arthroplasty.  Subjective:  Chief Complaint: right hip pain  HPI: Evelyn Nelson, 64 y.o. female, has a history of pain and functional disability in the right hip(s) due to arthritis and patient has failed non-surgical conservative treatments for greater than 12 weeks to include NSAID's and/or analgesics, flexibility and strengthening excercises and activity modification.  Onset of symptoms was abrupt starting 1 years ago with rapidlly worsening course since that time.The patient noted no past surgery on the right hip(s).  Patient currently rates pain in the right hip at 10 out of 10 with activity. Patient has night pain, worsening of pain with activity and weight bearing, pain that interfers with activities of daily living, pain with passive range of motion and crepitus. Patient has evidence of subchondral cysts, subchondral sclerosis, periarticular osteophytes and joint space narrowing by imaging studies. This condition presents safety issues increasing the risk of falls.  There is no current active infection.  Patient Active Problem List   Diagnosis Date Noted  . Osteoarthritis of right hip 04/09/2015  . Osteopenia 03/17/2012   Past Medical History  Diagnosis Date  . Osteopenia 04/2013    T score -1.8 FRAX 19%/1.2% recalculated after removing personal history of fracture  . Complication of anesthesia   . PONV (postoperative nausea and vomiting)   . Arthritis     OA, back (epidural injection x2)  & hip    Past Surgical History  Procedure Laterality Date  . Tubal ligation  1984  . Tonsillectomy      Prescriptions prior to admission  Medication Sig Dispense Refill Last Dose  . aspirin EC 81 MG tablet Take 81 mg by mouth daily.     Marland Kitchen GLUCOSAMINE HCL-MSM PO Take 2 tablets by mouth daily.     Marland Kitchen ibuprofen (ADVIL,MOTRIN) 800 MG tablet Take 800 mg by mouth 2 (two) times daily as needed (pain).      . Multiple Vitamin  (MULTIVITAMIN WITH MINERALS) TABS tablet Take 1 tablet by mouth daily.     . Omega-3 Fatty Acids (FISH OIL) 1200 MG CAPS Take 1,200 mg by mouth daily.      No Known Allergies  Social History  Substance Use Topics  . Smoking status: Never Smoker   . Smokeless tobacco: Not on file  . Alcohol Use: Yes     Comment: one beer a day- QUIT as of 2014    Family History  Problem Relation Age of Onset  . Lung cancer Father   . Hodgkin's lymphoma Son      Review of Systems  Musculoskeletal: Positive for back pain and joint pain.  All other systems reviewed and are negative.   Objective:  Physical Exam  Constitutional: She is oriented to person, place, and time. She appears well-developed and well-nourished.  HENT:  Head: Normocephalic and atraumatic.  Eyes: EOM are normal. Pupils are equal, round, and reactive to light.  Neck: Normal range of motion. Neck supple.  Cardiovascular: Normal rate and regular rhythm.   Respiratory: Effort normal and breath sounds normal.  GI: Soft. Bowel sounds are normal.  Musculoskeletal:       Right hip: She exhibits decreased range of motion, decreased strength, tenderness and bony tenderness.  Neurological: She is alert and oriented to person, place, and time.  Skin: Skin is warm and dry.  Psychiatric: She has a normal mood and affect.    Vital signs in last 24 hours:  Labs:   Estimated body mass index is 25.03 kg/(m^2) as calculated from the following:   Height as of 05/14/14: 5\' 6"  (1.676 m).   Weight as of 05/14/14: 70.308 kg (155 lb).   Imaging Review Plain radiographs demonstrate severe degenerative joint disease of the right hip(s). The bone quality appears to be good for age and reported activity level.  Assessment/Plan:  End stage arthritis, right hip(s)  The patient history, physical examination, clinical judgement of the provider and imaging studies are consistent with end stage degenerative joint disease of the right hip(s)  and total hip arthroplasty is deemed medically necessary. The treatment options including medical management, injection therapy, arthroscopy and arthroplasty were discussed at length. The risks and benefits of total hip arthroplasty were presented and reviewed. The risks due to aseptic loosening, infection, stiffness, dislocation/subluxation,  thromboembolic complications and other imponderables were discussed.  The patient acknowledged the explanation, agreed to proceed with the plan and consent was signed. Patient is being admitted for inpatient treatment for surgery, pain control, PT, OT, prophylactic antibiotics, VTE prophylaxis, progressive ambulation and ADL's and discharge planning.The patient is planning to be discharged home with home health services

## 2015-04-09 NOTE — Transfer of Care (Signed)
Immediate Anesthesia Transfer of Care Note  Patient: Evelyn Nelson  Procedure(s) Performed: Procedure(s) with comments: RIGHT TOTAL HIP ARTHROPLASTY ANTERIOR APPROACH (Right) - NEEDS RNFA  Patient Location: PACU  Anesthesia Type:MAC and Spinal  Level of Consciousness: awake, alert  and oriented  Airway & Oxygen Therapy: Patient Spontanous Breathing  Post-op Assessment: Report given to RN, Post -op Vital signs reviewed and stable and Patient moving all extremities  Post vital signs: Reviewed and stable  Last Vitals:  Filed Vitals:   04/09/15 1623  BP:   Pulse:   Temp: 36.4 C  Resp:     Complications: No apparent anesthesia complications

## 2015-04-09 NOTE — Anesthesia Preprocedure Evaluation (Addendum)
Anesthesia Evaluation  Patient identified by MRN, date of birth, ID band Patient awake    Reviewed: Allergy & Precautions, NPO status , Patient's Chart, lab work & pertinent test results  History of Anesthesia Complications (+) PONV  Airway Mallampati: II  TM Distance: >3 FB Neck ROM: Full    Dental  (+) Teeth Intact, Dental Advisory Given   Pulmonary neg pulmonary ROS,    breath sounds clear to auscultation       Cardiovascular negative cardio ROS   Rhythm:Regular Rate:Normal     Neuro/Psych negative neurological ROS     GI/Hepatic negative GI ROS, Neg liver ROS,   Endo/Other  negative endocrine ROS  Renal/GU negative Renal ROS     Musculoskeletal  (+) Arthritis ,   Abdominal   Peds  Hematology  (+) anemia ,   Anesthesia Other Findings   Reproductive/Obstetrics                           Anesthesia Physical Anesthesia Plan  ASA: II  Anesthesia Plan: MAC and Spinal   Post-op Pain Management:    Induction: Intravenous  Airway Management Planned: Simple Face Mask and Natural Airway  Additional Equipment:   Intra-op Plan:   Post-operative Plan:   Informed Consent: I have reviewed the patients History and Physical, chart, labs and discussed the procedure including the risks, benefits and alternatives for the proposed anesthesia with the patient or authorized representative who has indicated his/her understanding and acceptance.     Plan Discussed with: CRNA  Anesthesia Plan Comments:        Anesthesia Quick Evaluation

## 2015-04-09 NOTE — Anesthesia Procedure Notes (Signed)
Procedure Name: MAC Date/Time: 04/09/2015 2:05 PM Performed by: Trixie Deis A Pre-anesthesia Checklist: Patient identified, Timeout performed, Suction available, Patient being monitored and Emergency Drugs available Patient Re-evaluated:Patient Re-evaluated prior to inductionOxygen Delivery Method: Simple face mask Placement Confirmation: positive ETCO2 Dental Injury: Teeth and Oropharynx as per pre-operative assessment

## 2015-04-09 NOTE — Brief Op Note (Signed)
04/09/2015  4:28 PM  PATIENT:  Evelyn Nelson  64 y.o. female  PRE-OPERATIVE DIAGNOSIS:  severe osteoarthritis right hip  POST-OPERATIVE DIAGNOSIS:  severe osteoarthritis right hip  PROCEDURE:  Procedure(s) with comments: RIGHT TOTAL HIP ARTHROPLASTY ANTERIOR APPROACH (Right) - NEEDS RNFA  SURGEON:  Surgeon(s) and Role:    * Mcarthur Rossetti, MD - Primary    * Scott Marcene Duos, MD - Assisting  ANESTHESIA:   general  EBL:  Total I/O In: 2000 [I.V.:2000] Out: 435 [Urine:285; Blood:150]  BLOOD ADMINISTERED:none  DRAINS: none   LOCAL MEDICATIONS USED:  NONE  SPECIMEN:  No Specimen  DISPOSITION OF SPECIMEN:  N/A  COUNTS:  YES  TOURNIQUET:  * No tourniquets in log *  DICTATION: .Other Dictation: Dictation Number [id number]  PLAN OF CARE: Admit to inpatient   PATIENT DISPOSITION:  PACU - hemodynamically stable.   Delay start of Pharmacological VTE agent (>24hrs) due to surgical blood loss or risk of bleeding: no

## 2015-04-10 ENCOUNTER — Encounter (HOSPITAL_COMMUNITY): Payer: Self-pay | Admitting: Orthopaedic Surgery

## 2015-04-10 LAB — BASIC METABOLIC PANEL
Anion gap: 11 (ref 5–15)
BUN: 9 mg/dL (ref 6–20)
CO2: 28 mmol/L (ref 22–32)
Calcium: 9.3 mg/dL (ref 8.9–10.3)
Chloride: 96 mmol/L — ABNORMAL LOW (ref 101–111)
Creatinine, Ser: 0.7 mg/dL (ref 0.44–1.00)
GFR calc Af Amer: >60 mL/min (ref >60)
GFR calc non Af Amer: >60 mL/min (ref >60)
Glucose, Bld: 138 mg/dL — ABNORMAL HIGH (ref 65–99)
Potassium: 4.7 mmol/L (ref 3.5–5.1)
Sodium: 135 mmol/L (ref 135–145)

## 2015-04-10 LAB — CBC
HCT: 30.5 % — ABNORMAL LOW (ref 36.0–46.0)
Hemoglobin: 10.1 g/dL — ABNORMAL LOW (ref 12.0–15.0)
MCH: 30.8 pg (ref 26.0–34.0)
MCHC: 33.1 g/dL (ref 30.0–36.0)
MCV: 93 fL (ref 78.0–100.0)
Platelets: 245 10*3/uL (ref 150–400)
RBC: 3.28 MIL/uL — ABNORMAL LOW (ref 3.87–5.11)
RDW: 13.3 % (ref 11.5–15.5)
WBC: 6.8 10*3/uL (ref 4.0–10.5)

## 2015-04-10 MED ORDER — TRAMADOL HCL 50 MG PO TABS
100.0000 mg | ORAL_TABLET | Freq: Four times a day (QID) | ORAL | Status: DC | PRN
  Administered 2015-04-10: 100 mg via ORAL
  Filled 2015-04-10: qty 2

## 2015-04-10 NOTE — Care Management Note (Signed)
Case Management Note  Patient Details  Name: Evelyn Nelson MRN: [id number] Date of Birth: 1951-01-19  Subjective/Objective:          S/p right total hip arthroplasty          Action/Plan: Set up with [facility] for HHPT by MD office. Spoke with patient and her daughter, no change in discharge plan. Patient stated that she has a rolling walker and a 3N1 at home. Patient will be staying initially with her daughter after discharge, [place] Go aware and has address.    Expected Discharge Date:                  Expected Discharge Plan:  [place]  In-House Referral:  NA  Discharge planning Services  CM Consult  Post Acute Care Choice:  Home Health Choice offered to:  Patient  DME Arranged:    DME Agency:     HH Arranged:  PT HH Agency:  [place]  Status of Service:  Completed, signed off  Medicare Important Message Given:    Date Medicare IM Given:    Medicare IM give by:    Date Additional Medicare IM Given:    Additional Medicare Important Message give by:     If discussed at [place] of Stay Meetings, dates discussed:    Additional Comments:  Nila Nephew, RN 04/10/2015, 2:38 PM

## 2015-04-10 NOTE — Evaluation (Signed)
Physical Therapy Evaluation Patient Details Name: Evelyn Nelson MRN: [id number] DOB: 1950-07-28 Today's Date: 04/10/2015   History of Present Illness  Rt direct anterior THA, PMH: osteopenia, arthritis.  Clinical Impression  Pt is s/p direct anterior THA resulting in the deficits listed below (see PT Problem List). Pt will benefit from skilled PT to increase their independence and safety with mobility to allow discharge to the venue listed below. Patient reporting that she is planning to D/C to her daughter's home from the hospital. Patient able to ambulate 12 feet with rw during PT session, will continue to progress mobility as tolerated. No nausea during session, patient reporting having prior to session however.      Follow Up Recommendations Home health PT;Supervision for mobility/OOB    Equipment Recommendations  None recommended by PT;Other (comment) (patient reports having walker at home. )    Recommendations for Other Services       Precautions / Restrictions Precautions Precautions: None Restrictions Weight Bearing Restrictions: Yes RLE Weight Bearing: Weight bearing as tolerated      Mobility  Bed Mobility Overal bed mobility: Needs Assistance Bed Mobility: Supine to Sit     Supine to sit: Min guard     General bed mobility comments: cues for sequence  Transfers Overall transfer level: Needs assistance Equipment used: Rolling walker (2 wheeled) Transfers: Sit to/from Stand Sit to Stand: Min assist         General transfer comment: cues for hand position  Ambulation/Gait Ambulation/Gait assistance: Min guard Ambulation Distance (Feet): 12 Feet Assistive device: Rolling walker (2 wheeled) Gait Pattern/deviations: Step-to pattern;Decreased step length - right;Decreased step length - left;Decreased stance time - right;Decreased weight shift to right Gait velocity: decreased   General Gait Details: slow pattern and very guarded but improving as  ambulation progressed.   Stairs            Wheelchair Mobility    Modified Rankin (Stroke Patients Only)       Balance Overall balance assessment: Needs assistance Sitting-balance support: No upper extremity supported Sitting balance-Leahy Scale: Fair     Standing balance support: Bilateral upper extremity supported Standing balance-Leahy Scale: Poor Standing balance comment: using rw                             Pertinent Vitals/Pain Pain Assessment: 0-10 Pain Score: 5  Pain Location: Rt hip Pain Descriptors / Indicators: Heaviness Pain Intervention(s): Limited activity within patient's tolerance;Monitored during session    Home Living Family/patient expects to be discharged to:: Private residence (daughter's home) Living Arrangements: Children;Other (Comment) (lives with son) Available Help at Discharge: Family Type of Home: House Home Access: Stairs to enter Entrance Stairs-Rails:  (patient unsure) Entrance Stairs-Number of Steps: 4-5 (into daughter's home) Home Layout: One level Home Equipment: Walker - 2 wheels;Cane - single point      Prior Function Level of Independence: Independent               Hand Dominance        Extremity/Trunk Assessment   Upper Extremity Assessment: Defer to OT evaluation           Lower Extremity Assessment: RLE deficits/detail RLE Deficits / Details: fair strenght with RLE, moving independently with bed mobility       Communication   Communication: No difficulties  Cognition Arousal/Alertness: Awake/alert Behavior During Therapy: WFL for tasks assessed/performed Overall Cognitive Status: Within Functional Limits for tasks assessed  General Comments      Exercises        Assessment/Plan    PT Assessment Patient needs continued PT services  PT Diagnosis Difficulty walking;Abnormality of gait;Acute pain   PT Problem List Decreased strength;Decreased range  of motion;Decreased activity tolerance;Decreased balance;Decreased mobility;Pain  PT Treatment Interventions DME instruction;Gait training;Therapeutic activities;Functional mobility training;Stair training;Therapeutic exercise;Balance training;Patient/family education   PT Goals (Current goals can be found in the Care Plan section) Acute Rehab PT Goals Patient Stated Goal: go home to daughter's home at D/C PT Goal Formulation: With patient Time For Goal Achievement: 04/24/15 Potential to Achieve Goals: Good    Frequency 7X/week   Barriers to discharge        Co-evaluation               End of Session Equipment Utilized During Treatment: Gait belt Activity Tolerance: Patient tolerated treatment well;Other (comment) (reports nausea prior to session, denies during or after tx) Patient left: in chair;with call bell/phone within reach Nurse Communication: Mobility status;Weight bearing status;Precautions         Time: 3704-8889 PT Time Calculation (min) (ACUTE ONLY): 25 min   Charges:   PT Evaluation $Initial PT Evaluation Tier I: 1 Procedure PT Treatments $Gait Training: 8-22 mins   PT G Codes:        Cassell Clement, PT, CSCS Pager [phone] Office [phone]  04/10/2015, 11:29 AM

## 2015-04-10 NOTE — Progress Notes (Signed)
Subjective: 1 Day Post-Op Procedure(s) (LRB): RIGHT TOTAL HIP ARTHROPLASTY ANTERIOR APPROACH (Right) Patient reports pain as moderate.  Otherwise doing well.  Hgb 10.1, but only 11.6 pre-op.  Some component of acute blood loss anemia from surgery, but asymptomatic.  Objective: Vital signs in last 24 hours: Temp:  [97 F (36.1 C)-98.1 F (36.7 C)] 98.1 F (36.7 C) (10/19 0529) Pulse Rate:  [66-75] 71 (10/19 0529) Resp:  [0-18] 18 (10/19 0529) BP: (99-155)/(52-70) 100/55 mmHg (10/19 0529) SpO2:  [98 %-100 %] 98 % (10/19 0529) Weight:  [66.679 kg (147 lb)] 66.679 kg (147 lb) (10/18 1247)  Intake/Output from previous day: 10/18 0701 - 10/19 0700 In: 2230 [P.O.:80; I.V.:2150] Out: 685 [Urine:535; Blood:150] Intake/Output this shift:     Recent Labs  04/10/15 0309  HGB 10.1*    Recent Labs  04/10/15 0309  WBC 6.8  RBC 3.28*  HCT 30.5*  PLT 245    Recent Labs  04/10/15 0309  NA 135  K 4.7  CL 96*  CO2 28  BUN 9  CREATININE 0.70  GLUCOSE 138*  CALCIUM 9.3   No results for input(s): LABPT, INR in the last 72 hours.  Sensation intact distally Intact pulses distally Dorsiflexion/Plantar flexion intact Incision: dressing C/D/I  Assessment/Plan: 1 Day Post-Op Procedure(s) (LRB): RIGHT TOTAL HIP ARTHROPLASTY ANTERIOR APPROACH (Right) Up with therapy Discharge home with home health by Friday WBAT, no hip precautions  Evelyn Nelson Y 04/10/2015, 6:56 AM

## 2015-04-10 NOTE — Anesthesia Postprocedure Evaluation (Signed)
  Anesthesia Post-op Note  Patient: Evelyn Nelson  Procedure(s) Performed: Procedure(s) with comments: RIGHT TOTAL HIP ARTHROPLASTY ANTERIOR APPROACH (Right) - NEEDS RNFA  Patient Location: PACU  Anesthesia Type:MAC and Spinal  Level of Consciousness: awake and alert   Airway and Oxygen Therapy: Patient Spontanous Breathing  Post-op Pain: mild  Post-op Assessment: Post-op Vital signs reviewed LLE Motor Response: Purposeful movement, Responds to commands LLE Sensation: Full sensation, No numbness, No tingling RLE Motor Response: Purposeful movement, Responds to commands RLE Sensation: Full sensation, No numbness, No tingling L Sensory Level: S1-Sole of foot, small toes R Sensory Level: S1-Sole of foot, small toes  Post-op Vital Signs: Reviewed  Last Vitals:  Filed Vitals:   04/10/15 1243  BP: 90/45  Pulse: 78  Temp: 36.6 C  Resp: 48    Complications: No apparent anesthesia complications

## 2015-04-10 NOTE — Op Note (Signed)
NAME:  Evelyn Nelson, Evelyn Nelson NO.:  [id number]  MEDICAL RECORD NO.:  [id number]  LOCATION:  [id number]                        FACILITY:  [facility]  PHYSICIAN:  Lind Guest. Ninfa Linden, M.D.DATE OF BIRTH:  September 25, 1950  DATE OF PROCEDURE:  04/09/2015 DATE OF DISCHARGE:                              OPERATIVE REPORT   PREOPERATIVE DIAGNOSIS:  Primary osteoarthritis and degenerative joint disease, right hip.  POSTOPERATIVE DIAGNOSIS:  Primary osteoarthritis and degenerative joint disease, right hip.  PROCEDURE:  Right total hip arthroplasty through direct anterior approach.  IMPLANTS:  DePuy Sector Gription acetabular component size 50, size 32 +0 neutral polyethylene liner, size 12 Corail femoral component with standard varus offset (KLA), size 32 +1 ceramic hip ball.  SURGEON:  Lind Guest. Ninfa Linden, M.D.  ASSISTANT:  Anderson Malta, M.D.  ANESTHESIA:  Spinal.  BLOOD LOSS:  Less than 300 mL.  ANTIBIOTICS:  2 g IV Ancef.  COMPLICATIONS:  None.  INDICATIONS:  Evelyn Nelson is a 64 year old with debilitating right hip pain only  bothering for about a year.  It was worked up was originally maybe being her back, but eventually x-rays were obtained of her right hip and showed primary osteoarthritis of that hip.  Actually, her x-rays were significantly worse showing almost a dysplastic hip with complete loss of joint space, periarticular osteophytes, and severe sclerotic changes in the femoral head.  She had already failed all forms of conservative treatment.  Her pain is quite severe.  It has affected her mobility, her activities of daily living, and her quality of life.  At this point, she does wish to proceed with a total hip arthroplasty.  She understands we will do this through a direct anterior approach.  She also understands the risk of acute blood loss anemia, nerve and vessel injury, fracture, infection, dislocation, DVT.  She understands our goals are decreased pain,  improved mobility, and overall improved quality of life.  PROCEDURE DESCRIPTION:  After informed consent was obtained, appropriate right hip was marked, she was brought to the operating room where spinal anesthesia was obtained while she was on her stretcher.  A Foley catheter was then placed and traction boots were placed on both of her feet.  She was next placed supine on the Hana fracture table with perineal post in place and both legs in inline skeletal traction devices, but no traction applied.  Her right operative hip was then prepped and draped with DuraPrep and sterile drapes.  Time-out was called.  She was identified as the correct patient, correct right hip. I then made an incision inferior and posterior to the anterior-superior iliac spine and carried this obliquely down the  leg.  I dissected down the tensor fascia lata muscle.  The tensor fascia was then divided longitudinally so I could proceed with a direct anterior approach to the hip.  I identified and cauterized the lateral femoral circumflex vessels and then identified the femoral neck.  I opened up the hip capsule in L type format and placed the retractors within the joint capsule finding a very large joint effusion.  We then made our femoral neck cut proximal to the lesser trochanter and completed this with an  osteotome.  I placed a corkscrew guide in the femoral head and removed the femoral head in its entirety and found it to be completely devoid of cartilage with complete denuding.  I then placed a bent Hohmann over the medial acetabular rim and released the transverse acetabular ligament.  We cleaned the acetabular remnants to the acetabular labrum and debris. We then began reaming under direct visualization from a size 42 reamer all the way up to a size 48 with all reamers under direct visualization and the last reamer under direct fluoroscopy so we could obtain our depth of reaming, our inclination and  anteversion.  Once we did this, we were pleased to place the real DePuy Sector Gription acetabular component size 48 and single screw.  We then placed a 32+ 0 neutral polyethylene liner for that size acetabular component.  Attention was then turned to the femur.  With the leg externally rotated to 100 degrees, extended and adducted, we were able to place a Mueller retractor medially and a Hohmann retractor behind the greater trochanter and released the joint capsule.  I then used a box cutting osteotome to enter the femoral canal and a rongeur to lateralize.  We then began broaching from a size 8 broach using the Corail broaching system up to a size 12.  With the size 12 given her anatomy on x-ray and leg lengths, we placed a varus offset femoral neck and a 32 +1 hip ball.  We reduced this in the acetabulum and femorally she just had a little bit of extra Shuck but she was very stable to past 90 degrees of external rotation and 45 degrees of internal rotation.  We then brought her leg down into 45 degrees of extension and it was stable.  Her leg lengths and offsets were measured and were equal.  We then dislocated the hip and removed the trial components.  We placed the real Corail femoral component with varus offset followed by the real 32 +1 ceramic hip ball.  We reduced this in the acetabulum and again it was stable.  We then copiously irrigated the soft tissues with normal saline solution using pulsatile lavage, closed the joint capsule with interrupted #1 Ethibond suture, followed by running #1 Vicryl in the tensor fascia, 0 Vicryl in the deep tissue, 2-0 Vicryl in subcutaneous tissue, 4-0 Monocryl subcuticular stitch, and Steri-Strips on the skin.  An Aquacel dressing was applied.  She was then taken off the Hana table and taken to the recovery room in stable condition.  All final counts were correct.  There were no complications noted.     Lind Guest. Ninfa Linden,  M.D.     CYB/MEDQ  D:  04/09/2015  T:  04/09/2015  Job:  [id number]

## 2015-04-10 NOTE — Plan of Care (Signed)
Problem: Consults Goal: Diagnosis- Total Joint Replacement Outcome: Completed/Met Date Met:  04/10/15 Primary Total Hip Right

## 2015-04-11 LAB — CBC
HCT: 27 % — ABNORMAL LOW (ref 36.0–46.0)
Hemoglobin: 9.1 g/dL — ABNORMAL LOW (ref 12.0–15.0)
MCH: 31.3 pg (ref 26.0–34.0)
MCHC: 33.7 g/dL (ref 30.0–36.0)
MCV: 92.8 fL (ref 78.0–100.0)
Platelets: 196 10*3/uL (ref 150–400)
RBC: 2.91 MIL/uL — ABNORMAL LOW (ref 3.87–5.11)
RDW: 13.2 % (ref 11.5–15.5)
WBC: 6.8 10*3/uL (ref 4.0–10.5)

## 2015-04-11 MED ORDER — METHOCARBAMOL 500 MG PO TABS: 500.0000 mg | ORAL_TABLET | Freq: Four times a day (QID) | ORAL | Status: DC | PRN

## 2015-04-11 MED ORDER — ASPIRIN 325 MG PO TBEC: 325.0000 mg | DELAYED_RELEASE_TABLET | Freq: Two times a day (BID) | ORAL | Status: DC

## 2015-04-11 MED ORDER — TRAMADOL HCL 50 MG PO TABS: 50.0000 mg | ORAL_TABLET | Freq: Four times a day (QID) | ORAL | Status: DC | PRN

## 2015-04-11 NOTE — Discharge Instructions (Signed)

## 2015-04-11 NOTE — Progress Notes (Signed)
Physical Therapy Treatment Patient Details Name: Evelyn Nelson MRN: [id number] DOB: 08/02/1950 Today's Date: 04/11/2015    History of Present Illness Rt direct anterior THA, PMH: osteopenia, arthritis.    PT Comments    Patient is making good progress with PT regarding ambulation (150 feet) and general mobility. Anticipate patient will be able to ultimately DC home with family assistance. Patient reporting that she is hoping to be able to go home tomorrow. Will continue with PT services to increase independence prior to D/C.    Follow Up Recommendations  Home health PT;Supervision for mobility/OOB     Equipment Recommendations  None recommended by PT    Recommendations for Other Services       Precautions / Restrictions Precautions Precautions: None Restrictions Weight Bearing Restrictions: Yes RLE Weight Bearing: Weight bearing as tolerated    Mobility  Bed Mobility Overal bed mobility: Needs Assistance Bed Mobility: Sit to Supine       Sit to supine: Supervision   General bed mobility comments: bed flat, no use of rail  Transfers Overall transfer level: Needs assistance Equipment used: Rolling walker (2 wheeled) Transfers: Sit to/from Stand Sit to Stand: Supervision         General transfer comment: Supervision for safety. Good hand placement and technique. Sit <> stand from chair x 1  Ambulation/Gait Ambulation/Gait assistance: Supervision Ambulation Distance (Feet): 150 Feet Assistive device: Rolling walker (2 wheeled) Gait Pattern/deviations: Step-through pattern;Decreased weight shift to right Gait velocity: decreased   General Gait Details: mild decreased weightshift to RLE, cues for gradual progression as tolerated. No loss of balance.    Stairs            Wheelchair Mobility    Modified Rankin (Stroke Patients Only)       Balance Overall balance assessment: Needs assistance Sitting-balance support: No upper extremity  supported Sitting balance-Leahy Scale: Good     Standing balance support: During functional activity Standing balance-Leahy Scale: Fair Standing balance comment: using rw                    Cognition Arousal/Alertness: Awake/alert Behavior During Therapy: WFL for tasks assessed/performed Overall Cognitive Status: Within Functional Limits for tasks assessed                      Exercises Total Joint Exercises Ankle Circles/Pumps: AROM;Both;15 reps Quad Sets: Strengthening;Right;10 reps Gluteal Sets: Strengthening;Both;10 reps Short Arc Quad: Strengthening;Right;10 reps Heel Slides: AAROM;Right;10 reps Hip ABduction/ADduction: Strengthening;Right;10 reps;Other (comment) (min assist)    General Comments        Pertinent Vitals/Pain Pain Assessment: 0-10 Pain Score: 4  Pain Location: rt hip Pain Descriptors / Indicators: Burning Pain Intervention(s): Limited activity within patient's tolerance;Monitored during session    Home Living                      Prior Function            PT Goals (current goals can now be found in the care plan section) Acute Rehab PT Goals Patient Stated Goal: to go home tomorrow PT Goal Formulation: With patient Time For Goal Achievement: 04/24/15 Potential to Achieve Goals: Good Progress towards PT goals: Progressing toward goals    Frequency  7X/week    PT Plan Current plan remains appropriate    Co-evaluation             End of Session Equipment Utilized During Treatment: Gait belt Activity Tolerance: Patient  tolerated treatment well Patient left: in bed;with call bell/phone within reach;with SCD's reapplied     Time: 1329-1350 PT Time Calculation (min) (ACUTE ONLY): 21 min  Charges:  $Gait Training: 8-22 mins                    G Codes:      Cassell Clement, PT, CSCS Pager [phone] Office [phone]  04/11/2015, 3:40 PM

## 2015-04-11 NOTE — Care Management (Signed)
Utilization review completed. Suzanne Kho, RN Case Manager [phone]. 

## 2015-04-11 NOTE — Progress Notes (Signed)
Subjective: 2 Days Post-Op Procedure(s) (LRB): RIGHT TOTAL HIP ARTHROPLASTY ANTERIOR APPROACH (Right) Patient reports pain as moderate.  Feels much better today with less pain and less nausia.  Acute blood loss anemia from surgery, but doing well.  Objective: Vital signs in last 24 hours: Temp:  [97.9 F (36.6 C)-99.6 F (37.6 C)] 99.6 F (37.6 C) (10/20 0500) Pulse Rate:  [78-79] 78 (10/20 0500) Resp:  [18-48] 18 (10/20 0500) BP: (90-97)/(45-50) 91/49 mmHg (10/20 0500) SpO2:  [97 %-100 %] 97 % (10/20 0500)  Intake/Output from previous day: 10/19 0701 - 10/20 0700 In: 2320 [P.O.:720; I.V.:1600] Out: -  Intake/Output this shift:     Recent Labs  04/10/15 0309 04/11/15 0308  HGB 10.1* 9.1*    Recent Labs  04/10/15 0309 04/11/15 0308  WBC 6.8 6.8  RBC 3.28* 2.91*  HCT 30.5* 27.0*  PLT 245 196    Recent Labs  04/10/15 0309  NA 135  K 4.7  CL 96*  CO2 28  BUN 9  CREATININE 0.70  GLUCOSE 138*  CALCIUM 9.3   No results for input(s): LABPT, INR in the last 72 hours.  Sensation intact distally Intact pulses distally Dorsiflexion/Plantar flexion intact Incision: dressing C/D/I  Assessment/Plan: 2 Days Post-Op Procedure(s) (LRB): RIGHT TOTAL HIP ARTHROPLASTY ANTERIOR APPROACH (Right) Up with therapy Plan for discharge tomorrow Discharge home with home health  Mcarthur Rossetti 04/11/2015, 11:05 AM

## 2015-04-11 NOTE — Evaluation (Signed)
Occupational Therapy Evaluation Patient Details Name: Evelyn Nelson MRN: [id number] DOB: [birth year] Today's Date: 04/11/2015    History of Present Illness Rt direct anterior THA, PMH: osteopenia, arthritis.   Clinical Impression   Pt reports she was independent with ADLs PTA. Currently pt is at an overall supervision level for ADLs and functional mobility. Educated pt on compensatory strategies for LB ADLs, edema management techniques, walk in shower transfer technique, and tub transfer technique with 3 in 1; pt verbalized understanding. Pt plan to d/c to daughters home for first few days prior to returning to her home; pt will have intermittent supervision from family and friends at both locations. Pt would benefit from continued skilled OT in order to maximize independence and safety with tub transfers using a 3 in 1 prior to d/c.     Follow Up Recommendations  No OT follow up;Supervision - Intermittent    Equipment Recommendations  None recommended by OT    Recommendations for Other Services       Precautions / Restrictions Precautions Precautions: None Restrictions Weight Bearing Restrictions: Yes RLE Weight Bearing: Weight bearing as tolerated      Mobility Bed Mobility               General bed mobility comments: Pt in recliner, returned to recliner at end of session.  Transfers Overall transfer level: Needs assistance Equipment used: Rolling walker (2 wheeled) Transfers: Sit to/from Stand Sit to Stand: Supervision         General transfer comment: Supervision for safety. Good hand placement and technique. Sit <> stand from chair x 1    Balance Overall balance assessment: Needs assistance Sitting-balance support: No upper extremity supported Sitting balance-Leahy Scale: Fair     Standing balance support: Bilateral upper extremity supported Standing balance-Leahy Scale: Poor Standing balance comment: RW for suport                           ADL Overall ADL's : Needs assistance/impaired Eating/Feeding: Set up;Sitting   Grooming: Supervision/safety;Standing       Lower Body Bathing: Supervison/ safety;Sit to/from stand       Lower Body Dressing: Supervision/safety;Sit to/from stand Lower Body Dressing Details (indicate cue type and reason): Pt reports she was able to get underwear on by herself this morning Toilet Transfer: Supervision/safety;Ambulation;BSC;RW (BSC over toilet)   Toileting- Clothing Manipulation and Hygiene: Supervision/safety;Sit to/from stand       Functional mobility during ADLs: Supervision/safety;Rolling walker General ADL Comments: No family present during OT eval. Educated pt on compensatory strategies for LB ADLs, edema management techniques, walk in shower transfer, tub transfer with 3 in 1; pt verbalized understanding.      Vision     Perception     Praxis      Pertinent Vitals/Pain Pain Assessment: 0-10 Pain Score: 3  Pain Location: R hip Pain Descriptors / Indicators: Burning;Sore Pain Intervention(s): Limited activity within patient's tolerance;Monitored during session;Ice applied     Hand Dominance     Extremity/Trunk Assessment Upper Extremity Assessment Upper Extremity Assessment: Overall WFL for tasks assessed   Lower Extremity Assessment Lower Extremity Assessment: Defer to PT evaluation   Cervical / Trunk Assessment Cervical / Trunk Assessment: Normal   Communication Communication Communication: No difficulties   Cognition Arousal/Alertness: Awake/alert Behavior During Therapy: WFL for tasks assessed/performed Overall Cognitive Status: Within Functional Limits for tasks assessed  General Comments       Exercises       Shoulder Instructions      Home Living Family/patient expects to be discharged to:: Private residence (daughters home) Living Arrangements: Children;Other (Comment) (lives with son) Available Help at  Discharge: Family;Available PRN/intermittently Type of Home: House Home Access: Stairs to enter [employer] of Steps: 4-5 (into daughters home)   Home Layout: One level     Bathroom Shower/Tub: Walk-in shower;Tub/shower unit (Walk in at daughters, tub at pts house)   Bathroom Toilet: Standard Bathroom Accessibility: Yes How Accessible: Accessible via walker Home Equipment: [place] - 2 wheels;Cane - single point;Bedside commode;Shower seat   Additional Comments: Pt planning to stay with daughter for 2-3 days then return home.      Prior Functioning/Environment Level of Independence: Independent             OT Diagnosis: Generalized weakness;Acute pain   OT Problem List: Impaired balance (sitting and/or standing);Decreased safety awareness;Decreased knowledge of use of DME or AE;Decreased knowledge of precautions   OT Treatment/Interventions: Self-care/ADL training;DME and/or AE instruction;Patient/family education    OT Goals(Current goals can be found in the care plan section) Acute Rehab OT Goals Patient Stated Goal: to go home tomorrow OT Goal Formulation: With patient Time For Goal Achievement: 04/25/15 Potential to Achieve Goals: Good ADL Goals Pt Will Perform Tub/Shower Transfer: Tub transfer;with modified independence;ambulating;3 in 1;rolling walker  OT Frequency: Min 2X/week   Barriers to D/C: Decreased caregiver support          Co-evaluation              End of Session Equipment Utilized During Treatment: Rolling walker  Activity Tolerance: Patient tolerated treatment well Patient left: in chair;with call bell/phone within reach   Time: 4081-4481 OT Time Calculation (min): 16 min Charges:  OT General Charges $OT Visit: 1 Procedure OT Evaluation $Initial OT Evaluation Tier I: 1 Procedure G-Codes:     Binnie Kand M.S., OTR/L Pager: [phone]  04/11/2015, 9:59 AM

## 2015-04-12 NOTE — Progress Notes (Signed)
Physical Therapy Treatment Patient Details Name: Evelyn Nelson MRN: [id number] DOB: 10/08/1950 Today's Date: 04/12/2015    History of Present Illness Rt direct anterior THA, PMH: osteopenia, arthritis.    PT Comments    Patient is making good progress with PT.  From a mobility standpoint anticipate patient will be ready for DC home with family assistance. HEP and activity at home reviewed with patient, handout provided. Following session patient denies any questions or concerns.      Follow Up Recommendations  Home health PT;Supervision for mobility/OOB     Equipment Recommendations  None recommended by PT    Recommendations for Other Services       Precautions / Restrictions Precautions Precautions: None Restrictions Weight Bearing Restrictions: Yes RLE Weight Bearing: Weight bearing as tolerated    Mobility  Bed Mobility               General bed mobility comments: in chair upon arrival  Transfers Overall transfer level: Needs assistance Equipment used: Rolling walker (2 wheeled) Transfers: Sit to/from Stand Sit to Stand: Modified independent (Device/Increase time)            Ambulation/Gait Ambulation/Gait assistance: Supervision Ambulation Distance (Feet): 150 Feet Assistive device: Rolling walker (2 wheeled) Gait Pattern/deviations: Step-through pattern;Decreased weight shift to right Gait velocity: decreased   General Gait Details: stable pattern, no instability noted   Stairs Stairs: Yes Stairs assistance: Min guard Stair Management: One rail Left;Step to pattern;Alternating pattern Number of Stairs: 10 General stair comments: patient reports feeling confident with stairs, denies concerns  Wheelchair Mobility    Modified Rankin (Stroke Patients Only)       Balance Overall balance assessment: Needs assistance Sitting-balance support: No upper extremity supported Sitting balance-Leahy Scale: Good     Standing balance support:  During functional activity Standing balance-Leahy Scale: Fair Standing balance comment: using rw                    Cognition Arousal/Alertness: Awake/alert Behavior During Therapy: WFL for tasks assessed/performed Overall Cognitive Status: Within Functional Limits for tasks assessed                      Exercises Total Joint Exercises Ankle Circles/Pumps: AROM;Both;15 reps Quad Sets: Strengthening;Right;10 reps Short Arc Quad: Strengthening;Right;5 reps Heel Slides: AROM;Right;5 reps Hip ABduction/ADduction: Strengthening;Right;10 reps    General Comments        Pertinent Vitals/Pain Pain Assessment: 0-10 Pain Score: 3  Faces Pain Scale: Hurts a little bit Pain Location: Rt hip Pain Descriptors / Indicators: Sore Pain Intervention(s): Limited activity within patient's tolerance;Monitored during session    Home Living                      Prior Function            PT Goals (current goals can now be found in the care plan section) Acute Rehab PT Goals Patient Stated Goal: to go home today PT Goal Formulation: With patient Time For Goal Achievement: 04/24/15 Potential to Achieve Goals: Good Progress towards PT goals: Progressing toward goals    Frequency  7X/week    PT Plan Current plan remains appropriate    Co-evaluation             End of Session Equipment Utilized During Treatment: Gait belt Activity Tolerance: Patient tolerated treatment well Patient left: in chair;with call bell/phone within reach     Time: 0939-1003 PT Time Calculation (min) (ACUTE  ONLY): 24 min  Charges:  $Gait Training: 8-22 mins $Therapeutic Exercise: 8-22 mins                    G Codes:      Cassell Clement, PT, CSCS Pager [phone] Office [phone]  04/12/2015, 10:32 AM

## 2015-04-12 NOTE — Discharge Summary (Signed)
Physician Discharge Summary  Patient ID: Evelyn Nelson MRN: [id number] DOB/AGE: 03/18/1951 64 y.o.  Admit date: 04/09/2015 Discharge date: 04/12/2015  Admission Diagnoses: Osteoarthritis right hip  Discharge Diagnoses:  Principal Problem:   Osteoarthritis of right hip Active Problems:   Status post total replacement of right hip   Discharged Condition: stable  Hospital Course: Patient's hospital course was essentially unremarkable. She underwent total hip arthroplasty progressed well and was discharged to home.  Consults: None  Significant Diagnostic Studies: labs: Routine labs  Treatments: surgery: See operative note  Discharge Exam: Blood pressure 106/57, pulse 71, temperature 98.2 F (36.8 C), temperature source Oral, resp. rate 18, height 5' 5.5" (1.664 m), weight 66.679 kg (147 lb), SpO2 97 %. Incision/Wound: dressing clean and dry  Disposition: 01-Home or Self Care  Discharge Instructions    Call MD / Call 911    Complete by:  As directed   If you experience chest pain or shortness of breath, CALL 911 and be transported to the hospital emergency room.  If you develope a fever above 101 F, pus (white drainage) or increased drainage or redness at the wound, or calf pain, call your surgeon's office.     Constipation Prevention    Complete by:  As directed   Drink plenty of fluids.  Prune juice may be helpful.  You may use a stool softener, such as Colace (over the counter) 100 mg twice a day.  Use MiraLax (over the counter) for constipation as needed.     Diet - low sodium heart healthy    Complete by:  As directed      Increase activity slowly as tolerated    Complete by:  As directed             Medication List    STOP taking these medications        ibuprofen 800 MG tablet  Commonly known as:  ADVIL,MOTRIN      TAKE these medications        aspirin 325 MG EC tablet  Take 1 tablet (325 mg total) by mouth 2 (two) times daily after a meal.     Fish Oil  1200 MG Caps  Take 1,200 mg by mouth daily.     GLUCOSAMINE HCL-MSM PO  Take 2 tablets by mouth daily.     methocarbamol 500 MG tablet  Commonly known as:  ROBAXIN  Take 1 tablet (500 mg total) by mouth every 6 (six) hours as needed for muscle spasms.     multivitamin with minerals Tabs tablet  Take 1 tablet by mouth daily.     traMADol 50 MG tablet  Commonly known as:  ULTRAM  Take 1-2 tablets (50-100 mg total) by mouth every 6 (six) hours as needed for moderate pain.           Follow-up Information    Follow up with [facility].   Why:  They will contact you to schedule home therapy visits.   Contact information:   [address] [place] [address] [phone]       Follow up with Mcarthur Rossetti, MD In 2 weeks.   Specialty:  Orthopedic Surgery   Contact information:   [place] [address] [phone]       Follow up with [facility].   Why:  Someone from [facility] will contact you concerning start date and time for therapy.   Contact information:   [address] [place] [address] [phone]       Signed: Newt Minion 04/12/2015, 7:07 AM

## 2015-04-12 NOTE — Progress Notes (Signed)
Occupational Therapy Treatment Patient Details Name: Evelyn Nelson MRN: [id number] DOB: January 28, 1951 Today's Date: 04/12/2015    History of present illness Rt direct anterior THA, PMH: osteopenia, arthritis.   OT comments  Pt making good progress toward OT goals. Educated pt on tub transfer with 3 in 1, provided pt with handout; pt demonstrated understanding with supervision for safety. All education complete at this time, pt with no further questions or concerns for OT. Pt ready to d/c from an OT standpoint. Will continue to follow acutely.     Follow Up Recommendations  No OT follow up;Supervision - Intermittent    Equipment Recommendations  None recommended by OT    Recommendations for Other Services      Precautions / Restrictions Precautions Precautions: None Restrictions Weight Bearing Restrictions: Yes RLE Weight Bearing: Weight bearing as tolerated       Mobility Bed Mobility               General bed mobility comments: Pt in bathroom upon arrival, left in chair at end of session  Transfers Overall transfer level: Needs assistance Equipment used: Rolling walker (2 wheeled) Transfers: Sit to/from Stand Sit to Stand: Supervision              Balance Overall balance assessment: Needs assistance         Standing balance support: During functional activity Standing balance-Leahy Scale: Fair Standing balance comment: RW for support                   ADL Overall ADL's : Needs assistance/impaired     Grooming: Wash/dry face;Oral care;Standing;Supervision/safety                           Tub/ Shower Transfer: Tub transfer;Supervision/safety;Ambulation;3 in 1;Rolling walker   Functional mobility during ADLs: Supervision/safety;Rolling walker General ADL Comments: No family present during OT session. Educated pt in tub transfer with 3 in 1; pt demonstrated understanding. Educated pt on supervision for safety during ADLs and  functional mobility; pt verbalized understanding. Provided pt with 3 in 1 as tub seat handout.       Vision                     Perception     Praxis      Cognition   Behavior During Therapy: WFL for tasks assessed/performed Overall Cognitive Status: Within Functional Limits for tasks assessed                       Extremity/Trunk Assessment               Exercises     Shoulder Instructions       General Comments      Pertinent Vitals/ Pain       Pain Assessment: Faces Faces Pain Scale: Hurts a little bit Pain Location: R hip Pain Intervention(s): Limited activity within patient's tolerance;Monitored during session;Repositioned  Home Living                                          Prior Functioning/Environment              Frequency Min 2X/week     Progress Toward Goals  OT Goals(current goals can now be found in the care plan section)  Progress towards OT  goals: Progressing toward goals  Acute Rehab OT Goals Patient Stated Goal: to go home today OT Goal Formulation: With patient  Plan Discharge plan remains appropriate    Co-evaluation                 End of Session Equipment Utilized During Treatment: Rolling walker   Activity Tolerance Patient tolerated treatment well   Patient Left in chair;with call bell/phone within reach   Nurse Communication          Time: 2023-3435 OT Time Calculation (min): 19 min  Charges: OT General Charges $OT Visit: 1 Procedure OT Treatments $Self Care/Home Management : 8-22 mins  Binnie Kand M.S., OTR/L Pager: [phone]  04/12/2015, 9:02 AM

## 2015-04-24 ENCOUNTER — Other Ambulatory Visit: Payer: Self-pay

## 2015-04-24 DIAGNOSIS — Z1231 Encounter for screening mammogram for malignant neoplasm of breast: Secondary | ICD-10-CM

## 2015-05-20 ENCOUNTER — Encounter: Payer: Self-pay | Admitting: Women's Health

## 2015-05-20 ENCOUNTER — Other Ambulatory Visit (HOSPITAL_COMMUNITY)
Admission: RE | Admit: 2015-05-20 | Discharge: 2015-05-20 | Disposition: A | Discharge status: Home / Self Care | Payer: BLUE CROSS/BLUE SHIELD | Source: Ambulatory Visit | Attending: Gynecology | Admitting: Gynecology

## 2015-05-20 ENCOUNTER — Ambulatory Visit: Payer: Medicare Other

## 2015-05-20 ENCOUNTER — Ambulatory Visit
Admission: RE | Admit: 2015-05-20 | Discharge: 2015-05-20 | Disposition: A | Discharge status: Home / Self Care | Payer: BLUE CROSS/BLUE SHIELD | Source: Ambulatory Visit

## 2015-05-20 ENCOUNTER — Ambulatory Visit (INDEPENDENT_AMBULATORY_CARE_PROVIDER_SITE_OTHER): Payer: BLUE CROSS/BLUE SHIELD | Admitting: Women's Health

## 2015-05-20 VITALS — BP 110/80 | Ht 66.0 in | Wt 148.0 lb

## 2015-05-20 DIAGNOSIS — Z13220 Encounter for screening for lipoid disorders: Secondary | ICD-10-CM

## 2015-05-20 DIAGNOSIS — Z1231 Encounter for screening mammogram for malignant neoplasm of breast: Secondary | ICD-10-CM

## 2015-05-20 DIAGNOSIS — Z01419 Encounter for gynecological examination (general) (routine) without abnormal findings: Secondary | ICD-10-CM | POA: Diagnosis not present

## 2015-05-20 DIAGNOSIS — Z124 Encounter for screening for malignant neoplasm of cervix: Secondary | ICD-10-CM | POA: Diagnosis present

## 2015-05-20 DIAGNOSIS — N3001 Acute cystitis with hematuria: Secondary | ICD-10-CM

## 2015-05-20 LAB — URINALYSIS W MICROSCOPIC + REFLEX CULTURE
Bilirubin Urine: NEGATIVE
Casts: NONE SEEN [LPF]
Crystals: NONE SEEN [HPF]
Glucose, UA: NEGATIVE
Ketones, ur: NEGATIVE
Nitrite: POSITIVE — AB
Specific Gravity, Urine: 1.015 (ref 1.001–1.035)
Yeast: NONE SEEN [HPF]
pH: 6 (ref 5.0–8.0)

## 2015-05-20 LAB — CBC WITH DIFFERENTIAL/PLATELET
Basophils Absolute: 0 10*3/uL (ref 0.0–0.1)
Basophils Relative: 0 % (ref 0–1)
Eosinophils Absolute: 0.3 10*3/uL (ref 0.0–0.7)
Eosinophils Relative: 3 % (ref 0–5)
HCT: 32.7 % — ABNORMAL LOW (ref 36.0–46.0)
Hemoglobin: 11.4 g/dL — ABNORMAL LOW (ref 12.0–15.0)
Lymphocytes Relative: 11 % — ABNORMAL LOW (ref 12–46)
Lymphs Abs: 1.2 10*3/uL (ref 0.7–4.0)
MCH: 30.6 pg (ref 26.0–34.0)
MCHC: 34.9 g/dL (ref 30.0–36.0)
MCV: 87.9 fL (ref 78.0–100.0)
MPV: 7.8 fL — ABNORMAL LOW (ref 8.6–12.4)
Monocytes Absolute: 0.9 10*3/uL (ref 0.1–1.0)
Monocytes Relative: 8 % (ref 3–12)
Neutro Abs: 8.4 10*3/uL — ABNORMAL HIGH (ref 1.7–7.7)
Neutrophils Relative %: 78 % — ABNORMAL HIGH (ref 43–77)
Platelets: 324 10*3/uL (ref 150–400)
RBC: 3.72 MIL/uL — ABNORMAL LOW (ref 3.87–5.11)
RDW: 13.6 % (ref 11.5–15.5)
WBC: 10.8 10*3/uL — ABNORMAL HIGH (ref 4.0–10.5)

## 2015-05-20 LAB — LIPID PANEL
Cholesterol: 211 mg/dL — ABNORMAL HIGH (ref 125–200)
HDL: 115 mg/dL (ref >=46)
LDL Cholesterol: 83 mg/dL (ref <130)
Total CHOL/HDL Ratio: 1.8 Ratio (ref <=5.0)
Triglycerides: 65 mg/dL (ref <150)
VLDL: 13 mg/dL (ref <30)

## 2015-05-20 LAB — COMPREHENSIVE METABOLIC PANEL
ALT: 9 U/L (ref 6–29)
AST: 15 U/L (ref 10–35)
Albumin: 4.4 g/dL (ref 3.6–5.1)
Alkaline Phosphatase: 77 U/L (ref 33–130)
BUN: 10 mg/dL (ref 7–25)
CO2: 26 mmol/L (ref 20–31)
Calcium: 9.4 mg/dL (ref 8.6–10.4)
Chloride: 94 mmol/L — ABNORMAL LOW (ref 98–110)
Creat: 0.62 mg/dL (ref 0.50–0.99)
Glucose, Bld: 94 mg/dL (ref 65–99)
Potassium: 4.2 mmol/L (ref 3.5–5.3)
Sodium: 130 mmol/L — ABNORMAL LOW (ref 135–146)
Total Bilirubin: 0.5 mg/dL (ref 0.2–1.2)
Total Protein: 6.4 g/dL (ref 6.1–8.1)

## 2015-05-20 MED ORDER — SULFAMETHOXAZOLE-TRIMETHOPRIM 800-160 MG PO TABS: 1.0000 | ORAL_TABLET | Freq: Two times a day (BID) | ORAL | Status: DC

## 2015-05-20 NOTE — Patient Instructions (Signed)
Urinary Tract Infection Urinary tract infections (UTIs) can develop anywhere along your urinary tract. Your urinary tract is your body's drainage system for removing wastes and extra water. Your urinary tract includes two kidneys, two ureters, a bladder, and a urethra. Your kidneys are a pair of bean-shaped organs. Each kidney is about the size of your fist. They are located below your ribs, one on each side of your spine. CAUSES Infections are caused by microbes, which are microscopic organisms, including fungi, viruses, and bacteria. These organisms are so small that they can only be seen through a microscope. Bacteria are the microbes that most commonly cause UTIs. SYMPTOMS  Symptoms of UTIs may vary by age and gender of the patient and by the location of the infection. Symptoms in [place] women typically include a frequent and intense urge to urinate and a painful, burning feeling in the bladder or urethra during urination. Older women and men are more likely to be tired, shaky, and weak and have muscle aches and abdominal pain. A fever may mean the infection is in your kidneys. Other symptoms of a kidney infection include pain in your back or sides below the ribs, nausea, and vomiting. DIAGNOSIS To diagnose a UTI, your caregiver will ask you about your symptoms. Your caregiver will also ask you to provide a urine sample. The urine sample will be tested for bacteria and white blood cells. White blood cells are made by your body to help fight infection. TREATMENT  Typically, UTIs can be treated with medication. Because most UTIs are caused by a bacterial infection, they usually can be treated with the use of antibiotics. The choice of antibiotic and length of treatment depend on your symptoms and the type of bacteria causing your infection. HOME CARE INSTRUCTIONS  If you were prescribed antibiotics, take them exactly as your caregiver instructs you. Finish the medication even if you feel better after  you have only taken some of the medication.  Drink enough water and fluids to keep your urine clear or pale yellow.  Avoid caffeine, tea, and carbonated beverages. They tend to irritate your bladder.  Empty your bladder often. Avoid holding urine for long periods of time.  Empty your bladder before and after sexual intercourse.  After a bowel movement, women should cleanse from front to back. Use each tissue only once. SEEK MEDICAL CARE IF:   You have back pain.  You develop a fever.  Your symptoms do not begin to resolve within 3 days. SEEK IMMEDIATE MEDICAL CARE IF:   You have severe back pain or lower abdominal pain.  You develop chills.  You have nausea or vomiting.  You have continued burning or discomfort with urination. MAKE SURE YOU:   Understand these instructions.  Will watch your condition.  Will get help right away if you are not doing well or get worse.   This information is not intended to replace advice given to you by your health care provider. Make sure you discuss any questions you have with your health care provider.   Document Released: 03/18/2005 Document Revised: 02/27/2015 Document Reviewed: 07/17/2011 Elsevier Interactive Patient Education [employer]. Menopause is a normal process in which your reproductive ability comes to an end. This process happens gradually over a span of months to years, usually between the ages of 4 and 35. Menopause is complete when you have missed 12 consecutive menstrual periods. It is important to talk with your health care provider about some of the most common conditions  that affect postmenopausal women, such as heart disease, cancer, and bone loss (osteoporosis). Adopting a healthy lifestyle and getting preventive care can help to promote your health and wellness. Those actions can also lower your chances of developing some of these common conditions. WHAT SHOULD I KNOW ABOUT MENOPAUSE? During menopause, you may  experience a number of symptoms, such as:  Moderate-to-severe hot flashes.  Night sweats.  Decrease in sex drive.  Mood swings.  Headaches.  Tiredness.  Irritability.  Memory problems.  Insomnia. Choosing to treat or not to treat menopausal changes is an individual decision that you make with your health care provider. WHAT SHOULD I KNOW ABOUT HORMONE REPLACEMENT THERAPY AND SUPPLEMENTS? Hormone therapy products are effective for treating symptoms that are associated with menopause, such as hot flashes and night sweats. Hormone replacement carries certain risks, especially as you become older. If you are thinking about using estrogen or estrogen with progestin treatments, discuss the benefits and risks with your health care provider. WHAT SHOULD I KNOW ABOUT HEART DISEASE AND STROKE? Heart disease, heart attack, and stroke become more likely as you age. This may be due, in part, to the hormonal changes that your body experiences during menopause. These can affect how your body processes dietary fats, triglycerides, and cholesterol. Heart attack and stroke are both medical emergencies. There are many things that you can do to help prevent heart disease and stroke:  Have your blood pressure checked at least every 1-2 years. High blood pressure causes heart disease and increases the risk of stroke.  If you are 71-97 years old, ask your health care provider if you should take aspirin to prevent a heart attack or a stroke.  Do not use any tobacco products, including cigarettes, chewing tobacco, or electronic cigarettes. If you need help quitting, ask your health care provider.  It is important to eat a healthy diet and maintain a healthy weight.  Be sure to include plenty of vegetables, fruits, low-fat dairy products, and lean protein.  Avoid eating foods that are high in solid fats, added sugars, or salt (sodium).  Get regular exercise. This is one of the most important things that  you can do for your health.  Try to exercise for at least 150 minutes each week. The type of exercise that you do should increase your heart rate and make you sweat. This is known as moderate-intensity exercise.  Try to do strengthening exercises at least twice each week. Do these in addition to the moderate-intensity exercise.  Know your numbers.Ask your health care provider to check your cholesterol and your blood glucose. Continue to have your blood tested as directed by your health care provider. WHAT SHOULD I KNOW ABOUT CANCER SCREENING? There are several types of cancer. Take the following steps to reduce your risk and to catch any cancer development as early as possible. Breast Cancer  Practice breast self-awareness.  This means understanding how your breasts normally appear and feel.  It also means doing regular breast self-exams. Let your health care provider know about any changes, no matter how small.  If you are 19 or older, have a clinician do a breast exam (clinical breast exam or CBE) every year. Depending on your age, family history, and medical history, it may be recommended that you also have a yearly breast X-ray (mammogram).  If you have a family history of breast cancer, talk with your health care provider about genetic screening.  If you are at high risk for breast cancer,  talk with your health care provider about having an MRI and a mammogram every year.  Breast cancer (BRCA) gene test is recommended for women who have family members with BRCA-related cancers. Results of the assessment will determine the need for genetic counseling and BRCA1 and for BRCA2 testing. BRCA-related cancers include these types:  Breast. This occurs in males or females.  Ovarian.  Tubal. This may also be called fallopian tube cancer.  Cancer of the abdominal or pelvic lining (peritoneal cancer).  Prostate.  Pancreatic. Cervical, Uterine, and Ovarian Cancer Your health care  provider may recommend that you be screened regularly for cancer of the pelvic organs. These include your ovaries, uterus, and vagina. This screening involves a pelvic exam, which includes checking for microscopic changes to the surface of your cervix (Pap test).  For women ages 21-65, health care providers may recommend a pelvic exam and a Pap test every three years. For women ages 57-65, they may recommend the Pap test and pelvic exam, combined with testing for human papilloma virus (HPV), every five years. Some types of HPV increase your risk of cervical cancer. Testing for HPV may also be done on women of any age who have unclear Pap test results.  Other health care providers may not recommend any screening for nonpregnant women who are considered low risk for pelvic cancer and have no symptoms. Ask your health care provider if a screening pelvic exam is right for you.  If you have had past treatment for cervical cancer or a condition that could lead to cancer, you need Pap tests and screening for cancer for at least 20 years after your treatment. If Pap tests have been discontinued for you, your risk factors (such as having a new sexual partner) need to be reassessed to determine if you should start having screenings again. Some women have medical problems that increase the chance of getting cervical cancer. In these cases, your health care provider may recommend that you have screening and Pap tests more often.  If you have a family history of uterine cancer or ovarian cancer, talk with your health care provider about genetic screening.  If you have vaginal bleeding after reaching menopause, tell your health care provider.  There are currently no reliable tests available to screen for ovarian cancer. Lung Cancer Lung cancer screening is recommended for adults 60-40 years old who are at high risk for lung cancer because of a history of smoking. A yearly low-dose CT scan of the lungs is recommended  if you:  Currently smoke.  Have a history of at least 30 pack-years of smoking and you currently smoke or have quit within the past 15 years. A pack-year is smoking an average of one pack of cigarettes per day for one year. Yearly screening should:  Continue until it has been 15 years since you quit.  Stop if you develop a health problem that would prevent you from having lung cancer treatment. Colorectal Cancer  This type of cancer can be detected and can often be prevented.  Routine colorectal cancer screening usually begins at age 81 and continues through age 92.  If you have risk factors for colon cancer, your health care provider may recommend that you be screened at an earlier age.  If you have a family history of colorectal cancer, talk with your health care provider about genetic screening.  Your health care provider may also recommend using home test kits to check for hidden blood in your stool.  A  small camera at the end of a tube can be used to examine your colon directly (sigmoidoscopy or colonoscopy). This is done to check for the earliest forms of colorectal cancer.  Direct examination of the colon should be repeated every 5-10 years until age 56. However, if early forms of precancerous polyps or small growths are found or if you have a family history or genetic risk for colorectal cancer, you may need to be screened more often. Skin Cancer  Check your skin from head to toe regularly.  Monitor any moles. Be sure to tell your health care provider:  About any new moles or changes in moles, especially if there is a change in a mole's shape or color.  If you have a mole that is larger than the size of a pencil eraser.  If any of your family members has a history of skin cancer, especially at a [place] age, talk with your health care provider about genetic screening.  Always use sunscreen. Apply sunscreen liberally and repeatedly throughout the day.  Whenever you are  outside, protect yourself by wearing long sleeves, pants, a wide-brimmed hat, and sunglasses. WHAT SHOULD I KNOW ABOUT OSTEOPOROSIS? Osteoporosis is a condition in which bone destruction happens more quickly than new bone creation. After menopause, you may be at an increased risk for osteoporosis. To help prevent osteoporosis or the bone fractures that can happen because of osteoporosis, the following is recommended:  If you are 36-33 years old, get at least 1,000 mg of calcium and at least 600 mg of vitamin D per day.  If you are older than age 50 but younger than age 89, get at least 1,200 mg of calcium and at least 600 mg of vitamin D per day.  If you are older than age 35, get at least 1,200 mg of calcium and at least 800 mg of vitamin D per day. Smoking and excessive alcohol intake increase the risk of osteoporosis. Eat foods that are rich in calcium and vitamin D, and do weight-bearing exercises several times each week as directed by your health care provider. WHAT SHOULD I KNOW ABOUT HOW MENOPAUSE AFFECTS [place]? Depression may occur at any age, but it is more common as you become older. Common symptoms of depression include:  Low or sad mood.  Changes in sleep patterns.  Changes in appetite or eating patterns.  Feeling an overall lack of motivation or enjoyment of activities that you previously enjoyed.  Frequent crying spells. Talk with your health care provider if you think that you are experiencing depression. WHAT SHOULD I KNOW ABOUT IMMUNIZATIONS? It is important that you get and maintain your immunizations. These include:  Tetanus, diphtheria, and pertussis (Tdap) booster vaccine.  Influenza every year before the flu season begins.  Pneumonia vaccine.  Shingles vaccine. Your health care provider may also recommend other immunizations.   This information is not intended to replace advice given to you by your health care provider. Make sure you discuss any  questions you have with your health care provider.   Document Released: 07/31/2005 Document Revised: 06/29/2014 Document Reviewed: 02/08/2014 Elsevier Interactive Patient Education [employer].

## 2015-05-20 NOTE — Addendum Note (Signed)
Addended by: Burnett Kanaris on: 05/20/2015 12:30 PM   Modules accepted: Orders

## 2015-05-20 NOTE — Progress Notes (Signed)
Evelyn Nelson 08-16-1950 [id number]    History:    Presents for annual exam.  Postmenopausal on no HRT with no bleeding. Not sexually active. 2014 T score -1.8 at hip Osteopenia FRAX recalculated to 19%/1.2%. 04/09/2015 total right hip doing well. Has not had a colonoscopy. Did have Zostavax but not Pneumovax yet. Normal Pap and mammogram history.  Past medical history, past surgical history, family history and social history were all reviewed and documented in the EPIC chart. Planning to retire next year.  ROS:  A ROS was performed and pertinent positives and negatives are included.  Exam:  Filed Vitals:   05/20/15 0938  BP: 110/80    General appearance:  Normal Thyroid:  Symmetrical, normal in size, without palpable masses or nodularity. Respiratory  Auscultation:  Clear without wheezing or rhonchi Cardiovascular  Auscultation:  Regular rate, without rubs, murmurs or gallops  Edema/varicosities:  Not grossly evident Abdominal  Soft,nontender, without masses, guarding or rebound.  Liver/spleen:  No organomegaly noted  Hernia:  None appreciated  Skin  Inspection:  Grossly normal   Breasts: Examined lying and sitting.     Right: Without masses, retractions, discharge or axillary adenopathy.     Left: Without masses, retractions, discharge or axillary adenopathy. Gentitourinary   Inguinal/mons:  Normal without inguinal adenopathy  External genitalia:  Normal  BUS/Urethra/Skene's glands:  Normal  Vagina:  Normal  Cervix:  Normal  Uterus:  normal in size, shape and contour.  Midline and mobile  Adnexa/parametria:     Rt: Without masses or tenderness.   Lt: Without masses or tenderness.  Anus and perineum: Normal  Digital rectal exam: Normal sphincter tone without palpated masses or tenderness  Assessment/Plan:  64 y.o. DW F G2 P2 for annual exam with complaint of urinary frequency, urgency, pressure. UA: +3 blood, positive nitrites, +3 leukocytes, packed WBCs, packed RBCs,  many bacteria  Postmenopausal/no HRT/no bleeding/not sexually active  UTI with hematuria 04/09/2015 total right hip-follow-up scheduled 2014 osteopenia without elevated FRAX  Plan: Septra twice daily for 5 days, prescription, proper use given and reviewed. Instructed to call if no relief of symptoms. DEXA will schedule in several months. Home safety, fall prevention and importance of weightbearing exercise reviewed. SBE's, continue annual screening mammogram. Continue same vitamin D supplement, vitamin D level 51. Pneumovax, instructed to schedule at primary care or pharmacy. CBC, CMP, lipid panel, UA, Pap.   [place], 10:12 AM 05/20/2015

## 2015-05-22 LAB — URINE CULTURE: Colony Count: >=100000

## 2015-05-23 LAB — CYTOLOGY - PAP

## 2015-05-29 ENCOUNTER — Other Ambulatory Visit: Payer: Self-pay | Admitting: Women's Health

## 2015-05-29 MED ORDER — CIPROFLOXACIN HCL 250 MG PO TABS: 250.0000 mg | ORAL_TABLET | Freq: Two times a day (BID) | ORAL | Status: DC

## 2016-03-18 ENCOUNTER — Emergency Department (HOSPITAL_COMMUNITY): Payer: BLUE CROSS/BLUE SHIELD

## 2016-03-18 ENCOUNTER — Encounter (HOSPITAL_COMMUNITY): Payer: Self-pay | Admitting: Emergency Medicine

## 2016-03-18 ENCOUNTER — Emergency Department (HOSPITAL_COMMUNITY)
Admission: EM | Admit: 2016-03-18 | Discharge: 2016-03-18 | Disposition: A | Discharge status: Home / Self Care | Payer: BLUE CROSS/BLUE SHIELD | Attending: Emergency Medicine | Admitting: Emergency Medicine

## 2016-03-18 DIAGNOSIS — S6992XA Unspecified injury of left wrist, hand and finger(s), initial encounter: Secondary | ICD-10-CM | POA: Diagnosis present

## 2016-03-18 DIAGNOSIS — W010XXA Fall on same level from slipping, tripping and stumbling without subsequent striking against object, initial encounter: Secondary | ICD-10-CM | POA: Insufficient documentation

## 2016-03-18 DIAGNOSIS — Z7982 Long term (current) use of aspirin: Secondary | ICD-10-CM | POA: Diagnosis not present

## 2016-03-18 DIAGNOSIS — Y999 Unspecified external cause status: Secondary | ICD-10-CM | POA: Insufficient documentation

## 2016-03-18 DIAGNOSIS — S52502A Unspecified fracture of the lower end of left radius, initial encounter for closed fracture: Secondary | ICD-10-CM

## 2016-03-18 DIAGNOSIS — S52592A Other fractures of lower end of left radius, initial encounter for closed fracture: Secondary | ICD-10-CM | POA: Diagnosis not present

## 2016-03-18 DIAGNOSIS — Z96641 Presence of right artificial hip joint: Secondary | ICD-10-CM | POA: Diagnosis not present

## 2016-03-18 DIAGNOSIS — Y929 Unspecified place or not applicable: Secondary | ICD-10-CM | POA: Insufficient documentation

## 2016-03-18 DIAGNOSIS — Y939 Activity, unspecified: Secondary | ICD-10-CM | POA: Insufficient documentation

## 2016-03-18 MED ORDER — OXYCODONE-ACETAMINOPHEN 5-325 MG PO TABS
1.0000 | ORAL_TABLET | Freq: Once | ORAL | Status: AC
  Administered 2016-03-18: 1 via ORAL
  Filled 2016-03-18: qty 1

## 2016-03-18 MED ORDER — OXYCODONE-ACETAMINOPHEN 5-325 MG PO TABS: 1.0000 | ORAL_TABLET | Freq: Four times a day (QID) | ORAL | 0 refills | Status: DC | PRN

## 2016-03-18 NOTE — ED Provider Notes (Signed)
[facility] DEPT Provider Note   CSN: [id number] Arrival date & time: 03/18/16  2016  By signing my name below, I, Estanislado Pandy, attest that this documentation has been prepared under the direction and in the presence of Debroah Baller, NP. Electronically Signed: Estanislado Pandy, Scribe. 03/18/2016. 11:02 PM.   History   Chief Complaint Chief Complaint  Patient presents with  . Wrist Pain    The history is provided by the patient. No language interpreter was used.  Wrist Pain  This is a new problem. The current episode started 3 to 5 hours ago.   HPI Comments:  Evelyn Nelson is a 65 y.o. female who presents to the Emergency Department s/p a fall at 1800 in which she injured her L wrist. Pt reports she was visiting her mother in a nursing home and she tripped over a rug and used her L hand to brace the fall. No modifying factors noted. Pt denies LOC.   Past Medical History:  Diagnosis Date  . Arthritis    OA, back (epidural injection x2)  & hip  . Complication of anesthesia   . PONV (postoperative nausea and vomiting)     Patient Active Problem List   Diagnosis Date Noted  . Osteoarthritis of right hip 04/09/2015  . Status post total replacement of right hip 04/09/2015  . Osteopenia 03/17/2012    Past Surgical History:  Procedure Laterality Date  . TONSILLECTOMY    . TOTAL HIP ARTHROPLASTY Right 04/09/2015   Procedure: RIGHT TOTAL HIP ARTHROPLASTY ANTERIOR APPROACH;  Surgeon: Mcarthur Rossetti, MD;  Location: [facility];  Service: Orthopedics;  Laterality: Right;  NEEDS RNFA  . TUBAL LIGATION  1984    OB History    Gravida Para Term Preterm AB Living   2 2 2     2    SAB TAB Ectopic Multiple Live Births                   Home Medications    Prior to Admission medications   Medication Sig Start Date End Date Taking? Authorizing Provider  aspirin 81 MG tablet Take 81 mg by mouth daily.    Historical Provider, MD  ciprofloxacin (CIPRO) 250 MG tablet Take 1  tablet (250 mg total) by mouth 2 (two) times daily. 05/29/15   Huel Cote, NP  GLUCOSAMINE HCL-MSM PO Take 2 tablets by mouth daily.    Historical Provider, MD  Multiple Vitamin (MULTIVITAMIN WITH MINERALS) TABS tablet Take 1 tablet by mouth daily.    Historical Provider, MD  Omega-3 Fatty Acids (FISH OIL) 1200 MG CAPS Take 1,200 mg by mouth daily.    Historical Provider, MD  oxyCODONE-acetaminophen (ROXICET) 5-325 MG tablet Take 1 tablet by mouth every 6 (six) hours as needed for severe pain. 03/18/16   Hope Bunnie Pion, NP  sulfamethoxazole-trimethoprim (BACTRIM DS) 800-160 MG tablet Take 1 tablet by mouth 2 (two) times daily. 05/20/15   Huel Cote, NP    Family History Family History  Problem Relation Age of Onset  . Lung cancer Father   . Hodgkin's lymphoma Son     Social History Social History  Substance Use Topics  . Smoking status: Never Smoker  . Smokeless tobacco: Never Used  . Alcohol use No     Allergies   Review of patient's allergies indicates no known allergies.   Review of Systems Review of Systems  Musculoskeletal: Positive for arthralgias and joint swelling.  All other systems reviewed and are  negative.    Physical Exam Updated Vital Signs BP 153/76 (BP Location: Right Arm)   Pulse 74   Temp 98.6 F (37 C) (Oral)   Resp 16   Ht 5\' 7"  (1.702 m)   Wt 73 kg   SpO2 99%   BMI 25.22 kg/m   Physical Exam  Constitutional: She appears well-developed and well-nourished. No distress.  HENT:  Head: Normocephalic and atraumatic.  Eyes: Conjunctivae are normal.  Neck: Neck supple.  Cardiovascular: Normal rate.   Pulmonary/Chest: Effort normal.  Abdominal: She exhibits no distension.  Musculoskeletal:       Left wrist: She exhibits tenderness, bony tenderness and swelling. She exhibits no laceration. Decreased range of motion: due to pain.  Radial pulse 2+, adequate circulation.   Neurological: She is alert.  Skin: Skin is warm and dry.  Psychiatric:  She has a normal mood and affect. Her behavior is normal.  Nursing note and vitals reviewed.    ED Treatments / Results  DIAGNOSTIC STUDIES:  Oxygen Saturation is 99% on RA, normal by my interpretation.    COORDINATION OF CARE:  11:02 PM Discussed treatment plan with pt at bedside and pt agreed to plan.   Labs (all labs ordered are listed, but only abnormal results are displayed) Labs Reviewed - No data to display  Radiology Dg Wrist Complete Left  Result Date: 03/18/2016 CLINICAL DATA:  Status post fall onto left side, with left lateral wrist pain. Initial encounter. EXAM: LEFT WRIST - COMPLETE 3+ VIEW COMPARISON:  None. FINDINGS: There is question of a mildly displaced dorsal fragment arising from the distal radius, with likely intra-articular extension. Mild sclerosis and cortical irregularity is noted at the radial aspect of the carpal rows. The joint spaces are otherwise preserved. Mild soft tissue swelling is noted about the wrist. IMPRESSION: 1. Question of mildly displaced dorsal fragment arising from the distal radius, with likely intra-articular extension. Would correlate for associated symptoms. 2. Mild degenerative change at the radial aspect of the carpal rows. Electronically Signed   By: Garald Balding M.D.   On: 03/18/2016 21:40    Procedures Procedures (including critical care time)  Medications Ordered in ED Medications  oxyCODONE-acetaminophen (PERCOCET/ROXICET) 5-325 MG per tablet 1 tablet (1 tablet Oral Given 03/18/16 2308)     Initial Impression / Assessment and Plan / ED Course  Pt seen by Dr. Laverta Baltimore to discuss x-ray, and treatment that includes sugar-tong splint, pain management and followup with ortho.   I have reviewed the triage vital signs and the nursing notes.  Pertinent imaging results that were available during my care of the patient were reviewed by me and considered in my medical decision making (see chart for details).  Clinical Course     Final Clinical Impressions(s) / ED Diagnoses  65 y.o. female here with her daughter stable for d/c without focal neuro deficits. Patient to f/u with her orthopedic doctor. She will call tomorrow for appointment.  Final diagnoses:  Distal radius fracture, left, closed, initial encounter    New Prescriptions Discharge Medication List as of 03/18/2016 11:13 PM    START taking these medications   Details  oxyCODONE-acetaminophen (ROXICET) 5-325 MG tablet Take 1 tablet by mouth every 6 (six) hours as needed for severe pain., Starting Wed 03/18/2016, Print      I personally performed the services described in this documentation, which was scribed in my presence. The recorded information has been reviewed and is accurate.     Hope Bunnie Pion, NP  03/19/16 [place], MD 03/20/16 1008

## 2016-03-18 NOTE — ED Triage Notes (Signed)
Pt. tripped and fell this evening , denies LOC /ambulatory , reports left wrist pain with mild swelling . Alert and oriented / respirations unlabored .

## 2016-03-18 NOTE — Progress Notes (Signed)
Orthopedic Tech Progress Note Patient Details:  Evelyn Nelson June 10, 1951 [id number]  Ortho Devices Type of Ortho Device: Arm sling, Sugartong splint Ortho Device/Splint Location: lue Ortho Device/Splint Interventions: Ordered, Application   Karolee Stamps 03/18/2016, 11:34 PM

## 2016-04-06 ENCOUNTER — Ambulatory Visit (INDEPENDENT_AMBULATORY_CARE_PROVIDER_SITE_OTHER): Payer: BLUE CROSS/BLUE SHIELD | Admitting: Physician Assistant

## 2016-04-06 DIAGNOSIS — M1611 Unilateral primary osteoarthritis, right hip: Secondary | ICD-10-CM

## 2016-04-17 ENCOUNTER — Ambulatory Visit (INDEPENDENT_AMBULATORY_CARE_PROVIDER_SITE_OTHER): Payer: BLUE CROSS/BLUE SHIELD | Admitting: Orthopaedic Surgery

## 2016-04-17 ENCOUNTER — Ambulatory Visit (INDEPENDENT_AMBULATORY_CARE_PROVIDER_SITE_OTHER): Payer: BLUE CROSS/BLUE SHIELD

## 2016-04-17 ENCOUNTER — Encounter (INDEPENDENT_AMBULATORY_CARE_PROVIDER_SITE_OTHER): Payer: Self-pay | Admitting: Orthopaedic Surgery

## 2016-04-17 DIAGNOSIS — M25532 Pain in left wrist: Secondary | ICD-10-CM

## 2016-04-17 DIAGNOSIS — S52502D Unspecified fracture of the lower end of left radius, subsequent encounter for closed fracture with routine healing: Secondary | ICD-10-CM

## 2016-04-17 DIAGNOSIS — S52602D Unspecified fracture of lower end of left ulna, subsequent encounter for closed fracture with routine healing: Secondary | ICD-10-CM

## 2016-04-17 NOTE — Progress Notes (Signed)
   Office Visit Note   Patient: Evelyn Nelson           Date of Birth: 07/02/1950           MRN: [id number] Visit Date: 04/17/2016              Requested by: No referring provider defined for this encounter. PCP: Cyndi Bender, PA-C   Assessment & Plan: Visit Diagnoses:  1. Pain in left wrist   2. 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   Orders:  Orders Placed This Encounter  Procedures  . XR Wrist 2 Views Left   No orders of the defined types were placed in this encounter.     Procedures: No procedures performed   Clinical Data: No additional findings.   Subjective: Chief Complaint  Patient presents with  . Left Wrist - Fracture, Follow-up    4 week fracture follow up for nondisplaced distal radius fx.  Minimal pain.      Review of Systems   Objective: Vital Signs: There were no vitals taken for this visit.  Physical Exam  Left Hand Exam   Comments:  Stiffness of wrist.  No crps. Benign exam.      Specialty Comments:  No specialty comments available.  Imaging: Xr Wrist 2 Views Left  Result Date: 04/17/2016 Healing distal radius fracture.  No complications    PMFS History: Patient Active Problem List   Diagnosis Date Noted  . Closed fracture of left distal radius and ulna, with routine healing, subsequent encounter 04/17/2016  . Osteoarthritis of right hip 04/09/2015  . Status post total replacement of right hip 04/09/2015  . Osteopenia 03/17/2012   Past Medical History:  Diagnosis Date  . Arthritis    OA, back (epidural injection x2)  & hip  . Complication of anesthesia   . PONV (postoperative nausea and vomiting)     Family History    Problem Relation Age of Onset  . Lung cancer Father   . Hodgkin's lymphoma Son     Past Surgical History:  Procedure Laterality Date  . TONSILLECTOMY    . TOTAL HIP ARTHROPLASTY Right 04/09/2015   Procedure: RIGHT TOTAL HIP ARTHROPLASTY ANTERIOR APPROACH;  Surgeon: Mcarthur Rossetti, MD;  Location: [facility];  Service: Orthopedics;  Laterality: Right;  NEEDS RNFA  . TUBAL LIGATION  1984   Social History   Occupational History  . Not on file.   Social History Main Topics  . Smoking status: Never Smoker  . Smokeless tobacco: Never Used  . Alcohol use No  . Drug use: No  . Sexual activity: No

## 2016-04-24 ENCOUNTER — Telehealth (INDEPENDENT_AMBULATORY_CARE_PROVIDER_SITE_OTHER): Payer: Self-pay

## 2016-04-24 NOTE — Telephone Encounter (Signed)
What is patient's work status?  

## 2016-04-27 NOTE — Telephone Encounter (Signed)
Faythe Ghee will fill forms out

## 2016-04-27 NOTE — Telephone Encounter (Signed)
Out of work 

## 2016-04-28 ENCOUNTER — Telehealth (INDEPENDENT_AMBULATORY_CARE_PROVIDER_SITE_OTHER): Payer: Self-pay | Admitting: Orthopaedic Surgery

## 2016-04-28 NOTE — Telephone Encounter (Signed)
Patient called asked if she can get a call back concerning form [place] faxed over to be completed and faxed back. The form was faxed 04/20/16. Patient said she only got paid for 1 day.  The number to contact her is [phone]

## 2016-04-29 NOTE — Telephone Encounter (Signed)
Called patient to let her know both forms were faxed with notes.

## 2016-04-30 ENCOUNTER — Other Ambulatory Visit: Payer: Self-pay | Admitting: Women's Health

## 2016-04-30 DIAGNOSIS — Z1231 Encounter for screening mammogram for malignant neoplasm of breast: Secondary | ICD-10-CM

## 2016-05-18 ENCOUNTER — Encounter (INDEPENDENT_AMBULATORY_CARE_PROVIDER_SITE_OTHER): Payer: Self-pay | Admitting: Orthopaedic Surgery

## 2016-05-18 ENCOUNTER — Ambulatory Visit (INDEPENDENT_AMBULATORY_CARE_PROVIDER_SITE_OTHER): Payer: BLUE CROSS/BLUE SHIELD | Admitting: Orthopaedic Surgery

## 2016-05-18 ENCOUNTER — Ambulatory Visit (INDEPENDENT_AMBULATORY_CARE_PROVIDER_SITE_OTHER): Payer: BLUE CROSS/BLUE SHIELD

## 2016-05-18 DIAGNOSIS — S52532D Colles' fracture of left radius, subsequent encounter for closed fracture with routine healing: Secondary | ICD-10-CM

## 2016-05-18 DIAGNOSIS — M25532 Pain in left wrist: Secondary | ICD-10-CM | POA: Insufficient documentation

## 2016-05-18 NOTE — Progress Notes (Signed)
   Office Visit Note   Patient: Evelyn Nelson           Date of Birth: Nov 07, 1950           MRN: [id number] Visit Date: 05/18/2016              Requested by: Cyndi Bender, PA-C [address] [place], [place] [address] PCP: Cyndi Bender, PA-C   Assessment & Plan: Visit Diagnoses:  1. Closed Colles' fracture of left radius with routine healing, subsequent encounter     Plan: Begin weaning left wrist brace. Continue with hand therapy. Out of work for 4 more weeks and then return to work at that time. Follow-up in 4 weeks for clinical recheck. No x-rays are needed unless she is having an issue.  Follow-Up Instructions: Return in about 4 weeks (around 06/15/2016).   Orders:  Orders Placed This Encounter  Procedures  . XR Wrist Complete Left   No orders of the defined types were placed in this encounter.     Procedures: No procedures performed   Clinical Data: No additional findings.   Subjective: Chief Complaint  Patient presents with  . Left Wrist - Pain, Follow-up    HPI Evelyn Nelson is a week status post minimally displaced distal radius fracture she is doing much better today. She went to hand therapy twice a week. Her range of motion strength. She does has some 110 pain occasionally. Review of Systems   Objective: Vital Signs: There were no vitals taken for this visit.  Physical Exam  Ortho Exam Exam of the left wrist is improving. She has some mild swelling. Range of motion is improving. No signs of CRPS. Specialty Comments:  No specialty comments available.  Imaging: Xr Wrist Complete Left  Result Date: 05/18/2016 Healed left distal radius fracture. Generalized disuse osteopenia.    PMFS History: Patient Active Problem List   Diagnosis Date Noted  . Left wrist pain 05/18/2016  . Closed fracture of left distal radius and ulna, with routine healing, subsequent encounter 04/17/2016  . Osteoarthritis of right hip 04/09/2015  . Status post total  replacement of right hip 04/09/2015  . Osteopenia 03/17/2012   Past Medical History:  Diagnosis Date  . Arthritis    OA, back (epidural injection x2)  & hip  . Complication of anesthesia   . PONV (postoperative nausea and vomiting)     Family History  Problem Relation Age of Onset  . Lung cancer Father   . Hodgkin's lymphoma Son     Past Surgical History:  Procedure Laterality Date  . TONSILLECTOMY    . TOTAL HIP ARTHROPLASTY Right 04/09/2015   Procedure: RIGHT TOTAL HIP ARTHROPLASTY ANTERIOR APPROACH;  Surgeon: Mcarthur Rossetti, MD;  Location: [facility];  Service: Orthopedics;  Laterality: Right;  NEEDS RNFA  . TUBAL LIGATION  1984   Social History   Occupational History  . Not on file.   Social History Main Topics  . Smoking status: Never Smoker  . Smokeless tobacco: Never Used  . Alcohol use No  . Drug use: No  . Sexual activity: No

## 2016-05-21 ENCOUNTER — Ambulatory Visit (INDEPENDENT_AMBULATORY_CARE_PROVIDER_SITE_OTHER): Payer: BLUE CROSS/BLUE SHIELD | Admitting: Women's Health

## 2016-05-21 ENCOUNTER — Encounter: Payer: Self-pay | Admitting: Women's Health

## 2016-05-21 ENCOUNTER — Ambulatory Visit
Admission: RE | Admit: 2016-05-21 | Discharge: 2016-05-21 | Disposition: A | Discharge status: Home / Self Care | Payer: BLUE CROSS/BLUE SHIELD | Source: Ambulatory Visit | Attending: Women's Health | Admitting: Women's Health

## 2016-05-21 VITALS — BP 115/80 | Ht 66.0 in | Wt 167.4 lb

## 2016-05-21 DIAGNOSIS — Z13820 Encounter for screening for osteoporosis: Secondary | ICD-10-CM | POA: Diagnosis not present

## 2016-05-21 DIAGNOSIS — Z13220 Encounter for screening for lipoid disorders: Secondary | ICD-10-CM

## 2016-05-21 DIAGNOSIS — Z01419 Encounter for gynecological examination (general) (routine) without abnormal findings: Secondary | ICD-10-CM | POA: Diagnosis not present

## 2016-05-21 DIAGNOSIS — Z1231 Encounter for screening mammogram for malignant neoplasm of breast: Secondary | ICD-10-CM

## 2016-05-21 LAB — CBC WITH DIFFERENTIAL/PLATELET
Basophils Absolute: 51 cells/uL (ref 0–200)
Basophils Relative: 1 %
Eosinophils Absolute: 306 cells/uL (ref 15–500)
Eosinophils Relative: 6 %
HCT: 34.4 % — ABNORMAL LOW (ref 35.0–45.0)
Hemoglobin: 11.5 g/dL — ABNORMAL LOW (ref 11.7–15.5)
Lymphocytes Relative: 34 %
Lymphs Abs: 1734 cells/uL (ref 850–3900)
MCH: 30.7 pg (ref 27.0–33.0)
MCHC: 33.4 g/dL (ref 32.0–36.0)
MCV: 92 fL (ref 80.0–100.0)
MPV: 7.8 fL (ref 7.5–12.5)
Monocytes Absolute: 663 cells/uL (ref 200–950)
Monocytes Relative: 13 %
Neutro Abs: 2346 cells/uL (ref 1500–7800)
Neutrophils Relative %: 46 %
Platelets: 292 10*3/uL (ref 140–400)
RBC: 3.74 MIL/uL — ABNORMAL LOW (ref 3.80–5.10)
RDW: 14.1 % (ref 11.0–15.0)
WBC: 5.1 10*3/uL (ref 3.8–10.8)

## 2016-05-21 NOTE — Progress Notes (Signed)
Evelyn Nelson [birth year] [id number]    History:    Presents for annual exam. Post menopausal on no HRT with no bleeding. Not sexually active. Distal radial fracture at the end of September with traumatic fall, currently in physical therapy doing well. Bone density scan in 2014 T score -1.8 at hip osteopenia FRAX recalculated to 19%/1.2%. 04/09/2015 total right hip doing very well. Has not had pneumovax. Has never had a colonoscopy. Normal Pap and mammogram history. No urinary symptoms, hot flashes, or abdominal pain.   Past medical history, past surgical history, family history and social history were all reviewed and documented in the EPIC chart. Still working, no plans to retire. Mother at retirement community 55 years old, for fall prevention and aide with life activities. Mild dementia. Has 2 children both doing well.  ROS:  A ROS was performed and pertinent positives and negatives are included.  Exam:  Vitals:   05/21/16 0959  BP: 115/80  Weight: 167 lb 6.4 oz (75.9 kg)  Height: 5\' 6"  (1.676 m)   Body mass index is 27.02 kg/m.   General appearance:  Normal Thyroid:  Symmetrical, normal in size, without palpable masses or nodularity. Respiratory  Auscultation:  Clear without wheezing or rhonchi Cardiovascular  Auscultation:  Regular rate, without rubs, murmurs or gallops  Edema/varicosities:  Not grossly evident Abdominal  Soft,nontender, without masses, guarding or rebound.  Liver/spleen:  No organomegaly noted  Hernia:  None appreciated  Skin  Inspection:  Grossly normal   Breasts: Examined lying and sitting.     Right: Without masses, retractions, discharge or axillary adenopathy.     Left: Without masses, retractions, discharge or axillary adenopathy. Gentitourinary   Inguinal/mons:  Normal without inguinal adenopathy  External genitalia:  Normal  BUS/Urethra/Skene's glands:  Normal  Vagina:  Vaginal atrophy/asymptomatic  Cervix:  Normal  Uterus:  normal in size,  shape and contour.  Midline and mobile  Adnexa/parametria:     Rt: Without masses or tenderness.   Lt: Without masses or tenderness.  Anus and perineum: Normal  Digital rectal exam: Normal sphincter tone without palpated masses or tenderness  Assessment/Plan:  65 y.o. DW F G2 P2 for annual exam with no complaints.   Postmenopausal/ no HRT/ no bleeding/not sexually active 2014 Osteopenia without elevated FRAX  Plan: Dexa orders placed. Home safety, fall prevention, and importance of weightbearing exercises reviewed. SBE's, continue annual screening mammogram, 3D suggested for dense breasts. Continue vitamin D supplement. Pneumovax, prescription given. CBC, CMP, lipid panel, vitamin D, UA. Pap normal 2016, new screening guidelines reviewed. Encouraged screening colonoscopy [facility] information given instructed to schedule.    Huel Cote WHNP, 10:09 AM 05/21/2016

## 2016-05-21 NOTE — Patient Instructions (Signed)

## 2016-05-22 DIAGNOSIS — M8580 Other specified disorders of bone density and structure, unspecified site: Secondary | ICD-10-CM

## 2016-05-22 HISTORY — DX: Other specified disorders of bone density and structure, unspecified site: M85.80

## 2016-05-22 LAB — URINALYSIS W MICROSCOPIC + REFLEX CULTURE
Bacteria, UA: NONE SEEN [HPF]
Bilirubin Urine: NEGATIVE
Casts: NONE SEEN [LPF]
Crystals: NONE SEEN [HPF]
Glucose, UA: NEGATIVE
Hgb urine dipstick: NEGATIVE
Ketones, ur: NEGATIVE
Leukocytes, UA: NEGATIVE
Nitrite: NEGATIVE
Protein, ur: NEGATIVE
RBC / HPF: NONE SEEN RBC/HPF (ref <=2)
Specific Gravity, Urine: 1.016 (ref 1.001–1.035)
Squamous Epithelial / LPF: NONE SEEN [HPF] (ref <=5)
WBC, UA: NONE SEEN WBC/HPF (ref <=5)
Yeast: NONE SEEN [HPF]
pH: 6 (ref 5.0–8.0)

## 2016-05-22 LAB — COMPREHENSIVE METABOLIC PANEL
ALT: 11 U/L (ref 6–29)
AST: 19 U/L (ref 10–35)
Albumin: 4.4 g/dL (ref 3.6–5.1)
Alkaline Phosphatase: 49 U/L (ref 33–130)
BUN: 10 mg/dL (ref 7–25)
CO2: 25 mmol/L (ref 20–31)
Calcium: 9.3 mg/dL (ref 8.6–10.4)
Chloride: 96 mmol/L — ABNORMAL LOW (ref 98–110)
Creat: 0.73 mg/dL (ref 0.50–0.99)
Glucose, Bld: 94 mg/dL (ref 65–99)
Potassium: 4.4 mmol/L (ref 3.5–5.3)
Sodium: 131 mmol/L — ABNORMAL LOW (ref 135–146)
Total Bilirubin: 0.5 mg/dL (ref 0.2–1.2)
Total Protein: 6.7 g/dL (ref 6.1–8.1)

## 2016-05-22 LAB — LIPID PANEL
Cholesterol: 253 mg/dL — ABNORMAL HIGH (ref <200)
HDL: 159 mg/dL (ref >50)
LDL Cholesterol: 82 mg/dL (ref <100)
Total CHOL/HDL Ratio: 1.6 Ratio (ref <5.0)
Triglycerides: 59 mg/dL (ref <150)
VLDL: 12 mg/dL (ref <30)

## 2016-05-22 LAB — VITAMIN D 25 HYDROXY (VIT D DEFICIENCY, FRACTURES): Vit D, 25-Hydroxy: 33 ng/mL (ref 30–100)

## 2016-05-26 ENCOUNTER — Telehealth (INDEPENDENT_AMBULATORY_CARE_PROVIDER_SITE_OTHER): Payer: Self-pay | Admitting: Orthopaedic Surgery

## 2016-05-26 ENCOUNTER — Telehealth (INDEPENDENT_AMBULATORY_CARE_PROVIDER_SITE_OTHER): Payer: Self-pay | Admitting: *Deleted

## 2016-05-26 NOTE — Telephone Encounter (Signed)
Pt called stating she needs disability forms faxed saying she will be out for 4 more weeks

## 2016-05-26 NOTE — Telephone Encounter (Signed)
Patient called needing a call back concerning her disability papers. She advised she called [employer] and they have not received the papers back yet.   The number to contact patient is [phone]

## 2016-05-27 NOTE — Telephone Encounter (Signed)
Called patient back no answer. LMOM. Let her know the Disability  forms were faxed to [facility] with all office notes/ work notes

## 2016-06-04 ENCOUNTER — Other Ambulatory Visit: Payer: Self-pay | Admitting: Gynecology

## 2016-06-04 ENCOUNTER — Ambulatory Visit (INDEPENDENT_AMBULATORY_CARE_PROVIDER_SITE_OTHER): Payer: BLUE CROSS/BLUE SHIELD

## 2016-06-04 DIAGNOSIS — Z13820 Encounter for screening for osteoporosis: Secondary | ICD-10-CM | POA: Diagnosis not present

## 2016-06-04 DIAGNOSIS — M899 Disorder of bone, unspecified: Secondary | ICD-10-CM

## 2016-06-05 ENCOUNTER — Encounter: Payer: Self-pay | Admitting: Gynecology

## 2016-06-08 ENCOUNTER — Other Ambulatory Visit: Payer: Self-pay | Admitting: Gynecology

## 2016-06-08 DIAGNOSIS — M899 Disorder of bone, unspecified: Secondary | ICD-10-CM

## 2016-06-08 DIAGNOSIS — Z13820 Encounter for screening for osteoporosis: Secondary | ICD-10-CM

## 2016-06-18 ENCOUNTER — Ambulatory Visit (INDEPENDENT_AMBULATORY_CARE_PROVIDER_SITE_OTHER): Payer: BLUE CROSS/BLUE SHIELD | Admitting: Orthopaedic Surgery

## 2016-06-18 ENCOUNTER — Encounter (INDEPENDENT_AMBULATORY_CARE_PROVIDER_SITE_OTHER): Payer: Self-pay | Admitting: Orthopaedic Surgery

## 2016-06-18 DIAGNOSIS — S52532D Colles' fracture of left radius, subsequent encounter for closed fracture with routine healing: Secondary | ICD-10-CM | POA: Diagnosis not present

## 2016-06-18 NOTE — Progress Notes (Signed)
   Office Visit Note   Patient: Evelyn Nelson           Date of Birth: 10-03-1950           MRN: [id number] Visit Date: 06/18/2016              Requested by: Cyndi Bender, PA-C [address] [place], [place] [address] PCP: Cyndi Bender, PA-C   Assessment & Plan: Visit Diagnoses:  1. Closed Colles' fracture of left radius with routine healing, subsequent encounter     Plan: At this point she would like to go back to work. She can discontinue hand therapy and begin home exercises. She will let us know if she wants [place] to change her work restrictions if she is not able to do full duty. Otherwise I'll see her back as needed  Follow-Up Instructions: Return if symptoms worsen or fail to improve.   Orders:  No orders of the defined types were placed in this encounter.  No orders of the defined types were placed in this encounter.     Procedures: No procedures performed   Clinical Data: No additional findings.   Subjective: Chief Complaint  Patient presents with  . Left Wrist - Fracture, Follow-up    The patient follows up today for her left distal radius fracture. She is 3 months out now. She has no pain. She is working with hand therapy twice a week. And would like to go back to work.    Review of Systems   Objective: Vital Signs: There were no vitals taken for this visit.  Physical Exam  Ortho Exam Physical exam of the left wrist shows improving range of motion. There is no significant pain or swelling. Specialty Comments:  No specialty comments available.  Imaging: No results found.   PMFS History: Patient Active Problem List   Diagnosis Date Noted  . Left wrist pain 05/18/2016  . Closed fracture of left distal radius and ulna, with routine healing, subsequent encounter 04/17/2016  . Osteoarthritis of right hip 04/09/2015  . Status post total replacement of right hip 04/09/2015  . Osteopenia 03/17/2012   Past Medical History:  Diagnosis Date  .  Arthritis    OA, back (epidural injection x2)  & hip  . Complication of anesthesia   . Osteopenia 05/2016   T score -1.9 FRAX 18%/2.8%  . PONV (postoperative nausea and vomiting)     Family History  Problem Relation Age of Onset  . Lung cancer Father   . Hodgkin's lymphoma Son     Past Surgical History:  Procedure Laterality Date  . TONSILLECTOMY    . TOTAL HIP ARTHROPLASTY Right 04/09/2015   Procedure: RIGHT TOTAL HIP ARTHROPLASTY ANTERIOR APPROACH;  Surgeon: Mcarthur Rossetti, MD;  Location: [facility];  Service: Orthopedics;  Laterality: Right;  NEEDS RNFA  . TUBAL LIGATION  1984   Social History   Occupational History  . Not on file.   Social History Main Topics  . Smoking status: Never Smoker  . Smokeless tobacco: Never Used  . Alcohol use No  . Drug use: No  . Sexual activity: No

## 2016-12-29 ENCOUNTER — Encounter: Payer: Self-pay | Admitting: Nurse Practitioner

## 2017-07-06 ENCOUNTER — Other Ambulatory Visit: Payer: Self-pay | Admitting: Women's Health

## 2017-07-06 DIAGNOSIS — Z1231 Encounter for screening mammogram for malignant neoplasm of breast: Secondary | ICD-10-CM

## 2017-07-21 ENCOUNTER — Ambulatory Visit (INDEPENDENT_AMBULATORY_CARE_PROVIDER_SITE_OTHER): Payer: BLUE CROSS/BLUE SHIELD

## 2017-07-21 ENCOUNTER — Ambulatory Visit (INDEPENDENT_AMBULATORY_CARE_PROVIDER_SITE_OTHER): Payer: BLUE CROSS/BLUE SHIELD | Admitting: Physician Assistant

## 2017-07-21 ENCOUNTER — Encounter (INDEPENDENT_AMBULATORY_CARE_PROVIDER_SITE_OTHER): Payer: Self-pay | Admitting: Physician Assistant

## 2017-07-21 DIAGNOSIS — M25552 Pain in left hip: Secondary | ICD-10-CM | POA: Diagnosis not present

## 2017-07-21 DIAGNOSIS — M5442 Lumbago with sciatica, left side: Secondary | ICD-10-CM

## 2017-07-21 NOTE — Progress Notes (Signed)
Office Visit Note   Patient: Evelyn Nelson           Date of Birth: 05/07/1951           MRN: [id number] Visit Date: 07/21/2017              Requested by: Cyndi Bender, PA-C [address] [place], [place] [address] PCP: Cyndi Bender, PA-C   Assessment & Plan: Visit Diagnoses:  1. Acute bilateral low back pain with left-sided sciatica   2. Pain in left hip     Plan: We will send her for an intra-articular injection of the left hip with Dr. Ernestina Patches.  Place her on tramadol for pain mostly at night.  She can continue her Tylenol arthritis.  I explained to her that the hip injection is both hopefully therapeutic but also diagnostic.  We will see her back around February 27 to see how well she is done with the injection.  Did place her out of work until next Wednesday.  Follow-Up Instructions: Return in about 4 weeks (around 08/18/2017).   Orders:  Orders Placed This Encounter  Procedures  . XR HIP UNILAT W OR W/O PELVIS 1V LEFT  . XR Lumbar Spine 2-3 Views   No orders of the defined types were placed in this encounter.     Procedures: No procedures performed   Clinical Data: No additional findings.   Subjective: Chief Complaint  Patient presents with  . 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 Ninfa Linden in the past.  The right hip is overall doing well.  She is having now some low back pain but mostly left groin pain.  Pains been present for the past 2 weeks.  She states pain is getting worse.  Pain did start in the groin area.  She is having no numbness tingling down the leg but is having some pain that radiates down towards the knee but not past the knee. Review of Systems No fevers chills shortness of breath chest pain.  Objective: Vital Signs: There were no vitals taken for this visit.  Physical Exam  Constitutional: She is oriented to person, place, and time. She appears  well-developed and well-nourished. No distress.  Cardiovascular: Intact distal pulses.  Pulmonary/Chest: Effort normal.  Neurological: She is alert and oriented to person, place, and time.  Skin: She is not diaphoretic.  Psychiatric: She has a normal mood and affect.    Ortho Exam Out of 5 strength throughout lower extremities.  Negative straight leg raise bilaterally tight hamstrings bilaterally.  Sensation grossly intact bilateral feet.  She is nontender over the lower lumbar spine.  Some slight tenderness over the left trochanteric region.  Good range of motion of bilateral hips however internal rotation of the left hip causes pain. Specialty Comments:  No specialty comments available.  Imaging: Xr Hip Unilat W Or W/o Pelvis 1v Left  Result Date: 07/21/2017 AP pelvis and frog-leg pelvis left hip: Bilateral hips well located.  That is post right total hip arthroplasty without any complicating features.  Components appear well seated.  No acute fractures.  Left hip joint space is narrowed but not bone-on-bone.  Xr Lumbar Spine 2-3 Views  Result Date: 07/21/2017 Lumbar spine 2 views: No acute fracture.  Lymph no convex scoliosis with degenerative changes most notably L3 through S1.    PMFS History: Patient Active Problem List   Diagnosis  Date Noted  . Left wrist pain 05/18/2016  . Closed fracture of left distal radius and ulna, with routine healing, subsequent encounter 04/17/2016  . Osteoarthritis of right hip 04/09/2015  . Status post total replacement of right hip 04/09/2015  . Osteopenia 03/17/2012   Past Medical History:  Diagnosis Date  . Arthritis    OA, back (epidural injection x2)  & hip  . Complication of anesthesia   . Osteopenia 05/2016   T score -1.9 FRAX 18%/2.8%  . PONV (postoperative nausea and vomiting)     Family History  Problem Relation Age of Onset  . Lung cancer Father   . Hodgkin's lymphoma Son     Past Surgical History:  Procedure Laterality  Date  . TONSILLECTOMY    . TOTAL HIP ARTHROPLASTY Right 04/09/2015   Procedure: RIGHT TOTAL HIP ARTHROPLASTY ANTERIOR APPROACH;  Surgeon: Mcarthur Rossetti, MD;  Location: [facility];  Service: Orthopedics;  Laterality: Right;  NEEDS RNFA  . TUBAL LIGATION  1984   Social History   Occupational History  . Not on file  Tobacco Use  . Smoking status: Never Smoker  . Smokeless tobacco: Never Used  Substance and Sexual Activity  . Alcohol use: No  . Drug use: No  . Sexual activity: No

## 2017-07-27 ENCOUNTER — Encounter (INDEPENDENT_AMBULATORY_CARE_PROVIDER_SITE_OTHER): Payer: Self-pay | Admitting: Physical Medicine and Rehabilitation

## 2017-07-27 ENCOUNTER — Ambulatory Visit (INDEPENDENT_AMBULATORY_CARE_PROVIDER_SITE_OTHER): Payer: BLUE CROSS/BLUE SHIELD | Admitting: Physical Medicine and Rehabilitation

## 2017-07-27 ENCOUNTER — Ambulatory Visit (INDEPENDENT_AMBULATORY_CARE_PROVIDER_SITE_OTHER): Payer: BLUE CROSS/BLUE SHIELD

## 2017-07-27 DIAGNOSIS — M25552 Pain in left hip: Secondary | ICD-10-CM | POA: Diagnosis not present

## 2017-07-27 NOTE — Patient Instructions (Signed)

## 2017-07-27 NOTE — Progress Notes (Signed)
Evelyn Nelson - 67 y.o. female MRN [id number]  Date of birth: November 21, 1950  Office Visit Note: Visit Date: 07/27/2017 PCP: Cyndi Bender, PA-C Referred by: Cyndi Bender, PA-C  Subjective: Chief Complaint  Patient presents with  . Left Hip - Pain  . Left Leg - Pain   HPI: Evelyn Nelson is a 67 year old female with right total hip arthroplasty who is followed by Dr. Ninfa Linden and [name], P.A.-C.  She comes in today for left intra-articular anesthetic arthrogram of the left hip.  Is having left hip and groin pain.    ROS Otherwise per HPI.  Assessment & Plan: Visit Diagnoses:  1. Pain in left hip     Plan: Findings:  Diagnostic and hopefully therapeutic left anesthetic hip arthrogram.  Patient did have some relief of the groin pain but continued to have some pain and stiffness.    Meds & Orders: No orders of the defined types were placed in this encounter.   Orders Placed This Encounter  Procedures  . Large Joint Inj: L hip joint  . XR C-ARM NO REPORT    Follow-up: Return for Dr. Ninfa Linden.   Procedures: Large Joint Inj: L hip joint on 07/27/2017 10:46 AM Indications: pain and diagnostic evaluation Details: 22 G needle, anterior approach  Arthrogram: Yes  Medications: 80 mg triamcinolone acetonide 40 MG/ML; 3 mL bupivacaine 0.5 % Outcome: tolerated well, no immediate complications  Arthrogram demonstrated excellent flow of contrast throughout the joint surface without extravasation or obvious defect.  The patient had relief of symptoms during the anesthetic phase of the injection.  Procedure, treatment alternatives, risks and benefits explained, specific risks discussed. Consent was given by the patient. Immediately prior to procedure a time out was called to verify the correct patient, procedure, equipment, support staff and site/side marked as required. Patient was prepped and draped in the usual sterile fashion.      No notes on file   Clinical History: No  specialty comments available.  She reports that  has never smoked. she has never used smokeless tobacco. No results for input(s): HGBA1C, LABURIC in the last 8760 hours.  Objective:  VS:  HT:    WT:   BMI:     BP:   HR: bpm  TEMP: ( )  RESP:  Physical Exam  Ortho Exam Imaging: No results found.  Past Medical/Family/Surgical/Social History: Medications & Allergies reviewed per EMR Patient Active Problem List   Diagnosis Date Noted  . Left wrist pain 05/18/2016  . Closed fracture of left distal radius and ulna, with routine healing, subsequent encounter 04/17/2016  . Osteoarthritis of right hip 04/09/2015  . Status post total replacement of right hip 04/09/2015  . Osteopenia 03/17/2012   Past Medical History:  Diagnosis Date  . Arthritis    OA, back (epidural injection x2)  & hip  . Complication of anesthesia   . Osteopenia 05/2016   T score -1.9 FRAX 18%/2.8%  . PONV (postoperative nausea and vomiting)    Family History  Problem Relation Age of Onset  . Lung cancer Father   . Hodgkin's lymphoma Son    Past Surgical History:  Procedure Laterality Date  . TONSILLECTOMY    . TOTAL HIP ARTHROPLASTY Right 04/09/2015   Procedure: RIGHT TOTAL HIP ARTHROPLASTY ANTERIOR APPROACH;  Surgeon: Mcarthur Rossetti, MD;  Location: [facility];  Service: Orthopedics;  Laterality: Right;  NEEDS RNFA  . TUBAL LIGATION  1984   Social History   Occupational History  . Not  on file  Tobacco Use  . Smoking status: Never Smoker  . Smokeless tobacco: Never Used  Substance and Sexual Activity  . Alcohol use: No  . Drug use: No  . Sexual activity: No

## 2017-07-27 NOTE — Progress Notes (Deleted)
Pt states a dull pain in left hip, left groin, and left leg. Pt states pain has been going on for about 2 weeks. Pt states walking makes pain worse, pain medication eases pain. -dye allergies.

## 2017-07-29 MED ORDER — TRIAMCINOLONE ACETONIDE 40 MG/ML IJ SUSP
80.0000 mg | INTRAMUSCULAR | Status: AC | PRN
  Administered 2017-07-27: 80 mg via INTRA_ARTICULAR

## 2017-07-29 MED ORDER — BUPIVACAINE HCL 0.5 % IJ SOLN
3.0000 mL | INTRAMUSCULAR | Status: AC | PRN
  Administered 2017-07-27: 3 mL via INTRA_ARTICULAR

## 2017-08-18 ENCOUNTER — Ambulatory Visit
Admission: RE | Admit: 2017-08-18 | Discharge: 2017-08-18 | Disposition: A | Discharge status: Home / Self Care | Payer: BLUE CROSS/BLUE SHIELD | Source: Ambulatory Visit | Attending: Women's Health | Admitting: Women's Health

## 2017-08-18 ENCOUNTER — Encounter: Payer: Self-pay | Admitting: Women's Health

## 2017-08-18 ENCOUNTER — Ambulatory Visit (INDEPENDENT_AMBULATORY_CARE_PROVIDER_SITE_OTHER): Payer: BLUE CROSS/BLUE SHIELD | Admitting: Women's Health

## 2017-08-18 ENCOUNTER — Ambulatory Visit (INDEPENDENT_AMBULATORY_CARE_PROVIDER_SITE_OTHER): Payer: BLUE CROSS/BLUE SHIELD | Admitting: Orthopaedic Surgery

## 2017-08-18 ENCOUNTER — Encounter (INDEPENDENT_AMBULATORY_CARE_PROVIDER_SITE_OTHER): Payer: Self-pay | Admitting: Orthopaedic Surgery

## 2017-08-18 VITALS — BP 134/80 | Ht 66.0 in | Wt 172.0 lb

## 2017-08-18 DIAGNOSIS — M5442 Lumbago with sciatica, left side: Secondary | ICD-10-CM | POA: Insufficient documentation

## 2017-08-18 DIAGNOSIS — Z13220 Encounter for screening for lipoid disorders: Secondary | ICD-10-CM

## 2017-08-18 DIAGNOSIS — E559 Vitamin D deficiency, unspecified: Secondary | ICD-10-CM

## 2017-08-18 DIAGNOSIS — Z1231 Encounter for screening mammogram for malignant neoplasm of breast: Secondary | ICD-10-CM

## 2017-08-18 DIAGNOSIS — Z01419 Encounter for gynecological examination (general) (routine) without abnormal findings: Secondary | ICD-10-CM

## 2017-08-18 DIAGNOSIS — M25552 Pain in left hip: Secondary | ICD-10-CM | POA: Diagnosis not present

## 2017-08-18 MED ORDER — IBUPROFEN 800 MG PO TABS: 800.0000 mg | ORAL_TABLET | Freq: Three times a day (TID) | ORAL | 3 refills | Status: DC | PRN

## 2017-08-18 NOTE — Progress Notes (Signed)
Evelyn Nelson August 05, 1950 [id number]    History:    Presents for annual exam. Is menopausal on no HRT with no bleeding. Not sexually active. Normal Pap and mammogram history. 2017 T score -1.9 at hip FRAX 18%/2.8%. History of right hip replacement with good relief of pain. Left fractured arm from traumatic fall  2017. Has received both Pneumovax and Zostavax. Having left hip pain has follow-up scheduled today possible hip replacement from osteoarthritis.  Past medical history, past surgical history, family history and social history were all reviewed and documented in the EPIC chart. Continues to work full time. 2 children both doing well.  ROS:  A ROS was performed and pertinent positives and negatives are included.  Exam:  Vitals:   08/18/17 0951  BP: 134/80  Weight: 172 lb (78 kg)  Height: 5\' 6"  (1.676 m)   Body mass index is 27.76 kg/m.   General appearance:  Normal Thyroid:  Symmetrical, normal in size, without palpable masses or nodularity. Respiratory  Auscultation:  Clear without wheezing or rhonchi Cardiovascular  Auscultation:  Regular rate, without rubs, murmurs or gallops  Edema/varicosities:  Not grossly evident Abdominal  Soft,nontender, without masses, guarding or rebound.  Liver/spleen:  No organomegaly noted  Hernia:  None appreciated  Skin  Inspection:  Grossly normal   Breasts: Examined lying and sitting.     Right: Without masses, retractions, discharge or axillary adenopathy.     Left: Without masses, retractions, discharge or axillary adenopathy. Gentitourinary   Inguinal/mons:  Normal without inguinal adenopathy  External genitalia:  Normal  BUS/Urethra/Skene's glands:  Normal  Vagina:  Normal  Cervix:  Normal  Uterus: normal in size, shape and contour.  Midline and mobile  Adnexa/parametria:     Rt: Without masses or tenderness.   Lt: Without masses or tenderness.  Anus and perineum: Normal  Digital rectal exam: Normal sphincter tone without  palpated masses or tenderness  Assessment/Plan:  67 y.o. DW F G2 P2 for annual exam.     Postmenopausal/no HRT/no bleeding Osteoarthritis/severe left hip pain follow-up scheduled Osteopenia without elevated FRAX  Plan: Home safety, fall prevention and importance of weightbearing and balance type exercise reviewed. Encouraged yoga. Calcium rich foods, vitamin D 2000 daily encouraged. SBE's, continue annual screening mammogram has scheduled. Keep scheduled follow-up with orthopedist for hip pain. CBC, lipid panel, vitamin D, CMP. Pap normal screening guidelines reviewed.    [place], 10:18 AM 08/18/2017

## 2017-08-18 NOTE — Patient Instructions (Signed)
Health Maintenance for Postmenopausal Women Menopause is a normal process in which your reproductive ability comes to an end. This process happens gradually over a span of months to years, usually between the ages of 22 and 9. Menopause is complete when you have missed 12 consecutive menstrual periods. It is important to talk with your health care provider about some of the most common conditions that affect postmenopausal women, such as heart disease, cancer, and bone loss (osteoporosis). Adopting a healthy lifestyle and getting preventive care can help to promote your health and wellness. Those actions can also lower your chances of developing some of these common conditions. What should I know about menopause? During menopause, you may experience a number of symptoms, such as:  Moderate-to-severe hot flashes.  Night sweats.  Decrease in sex drive.  Mood swings.  Headaches.  Tiredness.  Irritability.  Memory problems.  Insomnia.  Choosing to treat or not to treat menopausal changes is an individual decision that you make with your health care provider. What should I know about hormone replacement therapy and supplements? Hormone therapy products are effective for treating symptoms that are associated with menopause, such as hot flashes and night sweats. Hormone replacement carries certain risks, especially as you become older. If you are thinking about using estrogen or estrogen with progestin treatments, discuss the benefits and risks with your health care provider. What should I know about heart disease and stroke? Heart disease, heart attack, and stroke become more likely as you age. This may be due, in part, to the hormonal changes that your body experiences during menopause. These can affect how your body processes dietary fats, triglycerides, and cholesterol. Heart attack and stroke are both medical emergencies. There are many things that you can do to help prevent heart disease  and stroke:  Have your blood pressure checked at least every 1-2 years. High blood pressure causes heart disease and increases the risk of stroke.  If you are 53-22 years old, ask your health care provider if you should take aspirin to prevent a heart attack or a stroke.  Do not use any tobacco products, including cigarettes, chewing tobacco, or electronic cigarettes. If you need help quitting, ask your health care provider.  It is important to eat a healthy diet and maintain a healthy weight. ? Be sure to include plenty of vegetables, fruits, low-fat dairy products, and lean protein. ? Avoid eating foods that are high in solid fats, added sugars, or salt (sodium).  Get regular exercise. This is one of the most important things that you can do for your health. ? Try to exercise for at least 150 minutes each week. The type of exercise that you do should increase your heart rate and make you sweat. This is known as moderate-intensity exercise. ? Try to do strengthening exercises at least twice each week. Do these in addition to the moderate-intensity exercise.  Know your numbers.Ask your health care provider to check your cholesterol and your blood glucose. Continue to have your blood tested as directed by your health care provider.  What should I know about cancer screening? There are several types of cancer. Take the following steps to reduce your risk and to catch any cancer development as early as possible. Breast Cancer  Practice breast self-awareness. ? This means understanding how your breasts normally appear and feel. ? It also means doing regular breast self-exams. Let your health care provider know about any changes, no matter how small.  If you are 40  or older, have a clinician do a breast exam (clinical breast exam or CBE) every year. Depending on your age, family history, and medical history, it may be recommended that you also have a yearly breast X-ray (mammogram).  If you  have a family history of breast cancer, talk with your health care provider about genetic screening.  If you are at high risk for breast cancer, talk with your health care provider about having an MRI and a mammogram every year.  Breast cancer (BRCA) gene test is recommended for women who have family members with BRCA-related cancers. Results of the assessment will determine the need for genetic counseling and BRCA1 and for BRCA2 testing. BRCA-related cancers include these types: ? Breast. This occurs in males or females. ? Ovarian. ? Tubal. This may also be called fallopian tube cancer. ? Cancer of the abdominal or pelvic lining (peritoneal cancer). ? Prostate. ? Pancreatic.  Cervical, Uterine, and Ovarian Cancer Your health care provider may recommend that you be screened regularly for cancer of the pelvic organs. These include your ovaries, uterus, and vagina. This screening involves a pelvic exam, which includes checking for microscopic changes to the surface of your cervix (Pap test).  For women ages 21-65, health care providers may recommend a pelvic exam and a Pap test every three years. For women ages 79-65, they may recommend the Pap test and pelvic exam, combined with testing for human papilloma virus (HPV), every five years. Some types of HPV increase your risk of cervical cancer. Testing for HPV may also be done on women of any age who have unclear Pap test results.  Other health care providers may not recommend any screening for nonpregnant women who are considered low risk for pelvic cancer and have no symptoms. Ask your health care provider if a screening pelvic exam is right for you.  If you have had past treatment for cervical cancer or a condition that could lead to cancer, you need Pap tests and screening for cancer for at least 20 years after your treatment. If Pap tests have been discontinued for you, your risk factors (such as having a new sexual partner) need to be  reassessed to determine if you should start having screenings again. Some women have medical problems that increase the chance of getting cervical cancer. In these cases, your health care provider may recommend that you have screening and Pap tests more often.  If you have a family history of uterine cancer or ovarian cancer, talk with your health care provider about genetic screening.  If you have vaginal bleeding after reaching menopause, tell your health care provider.  There are currently no reliable tests available to screen for ovarian cancer.  Lung Cancer Lung cancer screening is recommended for adults 69-62 years old who are at high risk for lung cancer because of a history of smoking. A yearly low-dose CT scan of the lungs is recommended if you:  Currently smoke.  Have a history of at least 30 pack-years of smoking and you currently smoke or have quit within the past 15 years. A pack-year is smoking an average of one pack of cigarettes per day for one year.  Yearly screening should:  Continue until it has been 15 years since you quit.  Stop if you develop a health problem that would prevent you from having lung cancer treatment.  Colorectal Cancer  This type of cancer can be detected and can often be prevented.  Routine colorectal cancer screening usually begins at  age 42 and continues through age 45.  If you have risk factors for colon cancer, your health care provider may recommend that you be screened at an earlier age.  If you have a family history of colorectal cancer, talk with your health care provider about genetic screening.  Your health care provider may also recommend using home test kits to check for hidden blood in your stool.  A small camera at the end of a tube can be used to examine your colon directly (sigmoidoscopy or colonoscopy). This is done to check for the earliest forms of colorectal cancer.  Direct examination of the colon should be repeated every  5-10 years until age 71. However, if early forms of precancerous polyps or small growths are found or if you have a family history or genetic risk for colorectal cancer, you may need to be screened more often.  Skin Cancer  Check your skin from head to toe regularly.  Monitor any moles. Be sure to tell your health care provider: ? About any new moles or changes in moles, especially if there is a change in a mole's shape or color. ? If you have a mole that is larger than the size of a pencil eraser.  If any of your family members has a history of skin cancer, especially at a young age, talk with your health care provider about genetic screening.  Always use sunscreen. Apply sunscreen liberally and repeatedly throughout the day.  Whenever you are outside, protect yourself by wearing long sleeves, pants, a wide-brimmed hat, and sunglasses.  What should I know about osteoporosis? Osteoporosis is a condition in which bone destruction happens more quickly than new bone creation. After menopause, you may be at an increased risk for osteoporosis. To help prevent osteoporosis or the bone fractures that can happen because of osteoporosis, the following is recommended:  If you are 46-71 years old, get at least 1,000 mg of calcium and at least 600 mg of vitamin D per day.  If you are older than age 55 but younger than age 65, get at least 1,200 mg of calcium and at least 600 mg of vitamin D per day.  If you are older than age 54, get at least 1,200 mg of calcium and at least 800 mg of vitamin D per day.  Smoking and excessive alcohol intake increase the risk of osteoporosis. Eat foods that are rich in calcium and vitamin D, and do weight-bearing exercises several times each week as directed by your health care provider. What should I know about how menopause affects my mental health? Depression may occur at any age, but it is more common as you become older. Common symptoms of depression  include:  Low or sad mood.  Changes in sleep patterns.  Changes in appetite or eating patterns.  Feeling an overall lack of motivation or enjoyment of activities that you previously enjoyed.  Frequent crying spells.  Talk with your health care provider if you think that you are experiencing depression. What should I know about immunizations? It is important that you get and maintain your immunizations. These include:  Tetanus, diphtheria, and pertussis (Tdap) booster vaccine.  Influenza every year before the flu season begins.  Pneumonia vaccine.  Shingles vaccine.  Your health care provider may also recommend other immunizations. This information is not intended to replace advice given to you by your health care provider. Make sure you discuss any questions you have with your health care provider. Document Released: 07/31/2005  Document Revised: 12/27/2015 Document Reviewed: 03/12/2015 Elsevier Interactive Patient Education  2018 [employer]  

## 2017-08-18 NOTE — Progress Notes (Signed)
Patient comes in today for follow-up for further evaluation and treatment of low back pain with left-sided sciatica as well as left hip pain.  She has a history of a right total hip arthroplasty.  On x-rays and felt like there was some joint space narrowing on the left side so we sent her for an intra-articular injection of the left hip.  She said that helped a little bit but really she has more pain in her low back that radiates down her backside into the groin to her knee and all the way down to her foot.  On exam she does have a positive straight leg raise to the left side.  I can more easily put her left hip through internal extra rotation which is some mild pain.  She has subjective numbness in her left foot on the dorsum of the lateral aspect of the leg.  She has no weakness.  Given the continued sciatic symptoms as well as the radicular pain going down her leg on the left side an MRI is warranted at this point of her lumbar spine to rule out herniated disc and to further assess what other modalities we can try to help decrease her discomfort.  I will send in 800 mg ibuprofen to her pharmacy we will see her back in 2 weeks to go over the MRI of her lumbar spine.

## 2017-08-19 LAB — LIPID PANEL
Cholesterol: 260 mg/dL — ABNORMAL HIGH (ref <200)
HDL: 132 mg/dL (ref >50)
LDL Cholesterol (Calc): 114 mg/dL (calc) — ABNORMAL HIGH
Non-HDL Cholesterol (Calc): 128 mg/dL (calc) (ref <130)
Total CHOL/HDL Ratio: 2 (calc) (ref <5.0)
Triglycerides: 62 mg/dL (ref <150)

## 2017-08-19 LAB — CBC WITH DIFFERENTIAL/PLATELET
Basophils Absolute: 28 cells/uL (ref 0–200)
Basophils Relative: 0.4 %
Eosinophils Absolute: 207 cells/uL (ref 15–500)
Eosinophils Relative: 3 %
HCT: 33.2 % — ABNORMAL LOW (ref 35.0–45.0)
Hemoglobin: 11.6 g/dL — ABNORMAL LOW (ref 11.7–15.5)
Lymphs Abs: 1497 cells/uL (ref 850–3900)
MCH: 31.6 pg (ref 27.0–33.0)
MCHC: 34.9 g/dL (ref 32.0–36.0)
MCV: 90.5 fL (ref 80.0–100.0)
MPV: 7.9 fL (ref 7.5–12.5)
Monocytes Relative: 10.4 %
Neutro Abs: 4451 cells/uL (ref 1500–7800)
Neutrophils Relative %: 64.5 %
Platelets: 333 10*3/uL (ref 140–400)
RBC: 3.67 10*6/uL — ABNORMAL LOW (ref 3.80–5.10)
RDW: 12.5 % (ref 11.0–15.0)
Total Lymphocyte: 21.7 %
WBC mixed population: 718 cells/uL (ref 200–950)
WBC: 6.9 10*3/uL (ref 3.8–10.8)

## 2017-08-19 LAB — COMPREHENSIVE METABOLIC PANEL
AG Ratio: 2.3 (calc) (ref 1.0–2.5)
ALT: 12 U/L (ref 6–29)
AST: 18 U/L (ref 10–35)
Albumin: 4.8 g/dL (ref 3.6–5.1)
Alkaline phosphatase (APISO): 68 U/L (ref 33–130)
BUN: 12 mg/dL (ref 7–25)
CO2: 27 mmol/L (ref 20–32)
Calcium: 9.8 mg/dL (ref 8.6–10.4)
Chloride: 95 mmol/L — ABNORMAL LOW (ref 98–110)
Creat: 0.75 mg/dL (ref 0.50–0.99)
Globulin: 2.1 g/dL (calc) (ref 1.9–3.7)
Glucose, Bld: 93 mg/dL (ref 65–99)
Potassium: 5.1 mmol/L (ref 3.5–5.3)
Sodium: 131 mmol/L — ABNORMAL LOW (ref 135–146)
Total Bilirubin: 0.5 mg/dL (ref 0.2–1.2)
Total Protein: 6.9 g/dL (ref 6.1–8.1)

## 2017-08-19 LAB — VITAMIN D 25 HYDROXY (VIT D DEFICIENCY, FRACTURES): Vit D, 25-Hydroxy: 43 ng/mL (ref 30–100)

## 2017-08-20 ENCOUNTER — Other Ambulatory Visit (INDEPENDENT_AMBULATORY_CARE_PROVIDER_SITE_OTHER): Payer: Self-pay

## 2017-08-20 DIAGNOSIS — M4807 Spinal stenosis, lumbosacral region: Secondary | ICD-10-CM

## 2017-09-01 ENCOUNTER — Ambulatory Visit (INDEPENDENT_AMBULATORY_CARE_PROVIDER_SITE_OTHER): Payer: BLUE CROSS/BLUE SHIELD | Admitting: Orthopaedic Surgery

## 2017-09-05 ENCOUNTER — Ambulatory Visit
Admission: RE | Admit: 2017-09-05 | Discharge: 2017-09-05 | Disposition: A | Discharge status: Home / Self Care | Payer: BLUE CROSS/BLUE SHIELD | Source: Ambulatory Visit | Attending: Orthopaedic Surgery | Admitting: Orthopaedic Surgery

## 2017-09-05 DIAGNOSIS — M4807 Spinal stenosis, lumbosacral region: Secondary | ICD-10-CM

## 2017-09-07 ENCOUNTER — Ambulatory Visit (INDEPENDENT_AMBULATORY_CARE_PROVIDER_SITE_OTHER): Payer: BLUE CROSS/BLUE SHIELD | Admitting: Physician Assistant

## 2017-09-07 ENCOUNTER — Encounter (INDEPENDENT_AMBULATORY_CARE_PROVIDER_SITE_OTHER): Payer: Self-pay | Admitting: Physician Assistant

## 2017-09-07 DIAGNOSIS — M5442 Lumbago with sciatica, left side: Secondary | ICD-10-CM

## 2017-09-07 NOTE — Progress Notes (Signed)
Office Visit Note   Patient: Evelyn Nelson           Date of Birth: [birth year]           MRN: [id number] Visit Date: 09/07/2017              Requested by: Cyndi Bender, PA-C [address] [place],  [id number] PCP: Cyndi Bender, PA-C   Assessment & Plan: Visit Diagnoses:  1. Left-sided low back pain with left-sided sciatica, unspecified chronicity     Plan: We will send her for epidural steroid injection lumbar spine.  Like to see her back 4 weeks after the injection to see what type of progress she had with the injection.  Questions encouraged and answered.  Follow-Up Instructions: Return in about 4 weeks (around 10/05/2017).   Orders:  No orders of the defined types were placed in this encounter.  No orders of the defined types were placed in this encounter.     Procedures: No procedures performed   Clinical Data: No additional findings.   Subjective: Chief Complaint  Patient presents with  . Lower Back - Follow-up    HPI Ms. Brackens returns today to go over the MRI results of her lumbar spine.  She states that the pain down her left leg is not as severe as it was.  But she still has pain that occasionally goes down the leg and some left hip groin pain.  Most of the pain is down the lateral aspect of the leg and goes into the foot also the anterior lateral aspect. MRI is reviewed with the patient exams dated 09/05/2017 and compared to the MRI in 2015.  Lumbar spine model is used for visual demonstration.  MRI showed progression at L3-4 mild and L4-5 moderate to severe with left lateral recess stenosis at both levels.  At L5-S1 moderate to severe left foraminal stenosis which appeared to be unchanged.  Increased moderate to severe right L4 neuroforaminal stenosis. Review of Systems No waking pain.  No fevers chills.  Otherwise please see HPI  Objective: Vital Signs: There were no vitals taken for this visit.  Physical Exam  Constitutional: She is oriented to  person, place, and time. She appears well-developed and well-nourished. No distress.  Cardiovascular: Intact distal pulses.  Neurological: She is alert and oriented to person, place, and time.  Skin: She is not diaphoretic.  Psychiatric: She has a normal mood and affect.    Ortho Exam 5 out of 5 strength throughout the lower extremities against resistance.  Exquisitely tight hamstrings bilaterally negative straight leg raise bilaterally. Specialty Comments:  No specialty comments available.  Imaging: No results found.   PMFS History: Patient Active Problem List   Diagnosis Date Noted  . Pain in left hip 08/18/2017  . Left-sided low back pain with left-sided sciatica 08/18/2017  . Left wrist pain 05/18/2016  . Closed fracture of left distal radius and ulna, with routine healing, subsequent encounter 04/17/2016  . Osteoarthritis of right hip 04/09/2015  . Status post total replacement of right hip 04/09/2015  . Osteopenia 03/17/2012   Past Medical History:  Diagnosis Date  . Arthritis    OA, back (epidural injection x2)  & hip  . Complication of anesthesia   . Osteopenia 05/2016   T score -1.9 FRAX 18%/2.8%  . PONV (postoperative nausea and vomiting)     Family History  Problem Relation Age of Onset  . Lung cancer Father   . Hodgkin's lymphoma Son  Past Surgical History:  Procedure Laterality Date  . TONSILLECTOMY    . TOTAL HIP ARTHROPLASTY Right 04/09/2015   Procedure: RIGHT TOTAL HIP ARTHROPLASTY ANTERIOR APPROACH;  Surgeon: Mcarthur Rossetti, MD;  Location: [place];  Service: Orthopedics;  Laterality: Right;  NEEDS RNFA  . TUBAL LIGATION  1984   Social History   Occupational History  . Not on file  Tobacco Use  . Smoking status: Never Smoker  . Smokeless tobacco: Never Used  Substance and Sexual Activity  . Alcohol use: No  . Drug use: No  . Sexual activity: No

## 2017-09-27 ENCOUNTER — Telehealth (INDEPENDENT_AMBULATORY_CARE_PROVIDER_SITE_OTHER): Payer: Self-pay | Admitting: Physician Assistant

## 2017-09-28 ENCOUNTER — Other Ambulatory Visit (INDEPENDENT_AMBULATORY_CARE_PROVIDER_SITE_OTHER): Payer: Self-pay

## 2017-09-28 DIAGNOSIS — G8929 Other chronic pain: Secondary | ICD-10-CM

## 2017-09-28 DIAGNOSIS — M5442 Lumbago with sciatica, left side: Principal | ICD-10-CM

## 2017-09-28 NOTE — Telephone Encounter (Signed)
Yes it was just for injection, not sure why order wasn't in-it's there now.

## 2017-09-28 NOTE — Telephone Encounter (Signed)
Will send referral to Dr. Ernestina Patches to advise and call patient to schedule.

## 2017-10-04 ENCOUNTER — Telehealth (INDEPENDENT_AMBULATORY_CARE_PROVIDER_SITE_OTHER): Payer: Self-pay | Admitting: Physician Assistant

## 2017-10-04 NOTE — Telephone Encounter (Signed)
Patient had someone call for her to make an appointment, she wanted an appointment to receive cortisone injections. The appointment is for 4/18 with Artis Delay

## 2017-10-04 NOTE — Telephone Encounter (Signed)
We have been trying to schedule from referral list. Needs to be scheduled with Dr. Ernestina Patches and appointment with Artis Delay needs to be cancelled.

## 2017-10-04 NOTE — Telephone Encounter (Signed)
I am assuming this is for a [place] injection

## 2017-10-07 ENCOUNTER — Ambulatory Visit (INDEPENDENT_AMBULATORY_CARE_PROVIDER_SITE_OTHER): Payer: BLUE CROSS/BLUE SHIELD | Admitting: Physician Assistant

## 2017-10-07 NOTE — Telephone Encounter (Signed)
Pt came to appt for Artis Delay today and she was scheduled for 10/28/17 with Dr. Ernestina Patches.

## 2017-10-21 ENCOUNTER — Ambulatory Visit (INDEPENDENT_AMBULATORY_CARE_PROVIDER_SITE_OTHER): Payer: BLUE CROSS/BLUE SHIELD | Admitting: Orthopaedic Surgery

## 2017-10-28 ENCOUNTER — Ambulatory Visit (INDEPENDENT_AMBULATORY_CARE_PROVIDER_SITE_OTHER): Payer: BLUE CROSS/BLUE SHIELD

## 2017-10-28 ENCOUNTER — Ambulatory Visit (INDEPENDENT_AMBULATORY_CARE_PROVIDER_SITE_OTHER): Payer: BLUE CROSS/BLUE SHIELD | Admitting: Physical Medicine and Rehabilitation

## 2017-10-28 ENCOUNTER — Encounter (INDEPENDENT_AMBULATORY_CARE_PROVIDER_SITE_OTHER): Payer: Self-pay | Admitting: Physical Medicine and Rehabilitation

## 2017-10-28 VITALS — BP 149/99 | HR 82 | Temp 99.0°F

## 2017-10-28 DIAGNOSIS — M5416 Radiculopathy, lumbar region: Secondary | ICD-10-CM | POA: Diagnosis not present

## 2017-10-28 DIAGNOSIS — M48062 Spinal stenosis, lumbar region with neurogenic claudication: Secondary | ICD-10-CM | POA: Diagnosis not present

## 2017-10-28 MED ORDER — BETAMETHASONE SOD PHOS & ACET 6 (3-3) MG/ML IJ SUSP
12.0000 mg | Freq: Once | INTRAMUSCULAR | Status: AC
  Administered 2017-10-28: 12 mg

## 2017-10-28 NOTE — Progress Notes (Signed)
  Numeric Pain Rating Scale and Functional Assessment Average Pain 3   In the last MONTH (on 0-10 scale) has pain interfered with the following?  1. General activity like being  able to carry out your everyday physical activities such as walking, climbing stairs, carrying groceries, or moving a chair?  Rating(5)   +Driver, -BT, -Dye Allergies.  

## 2017-10-28 NOTE — Patient Instructions (Signed)

## 2017-11-08 ENCOUNTER — Ambulatory Visit (INDEPENDENT_AMBULATORY_CARE_PROVIDER_SITE_OTHER): Payer: Self-pay | Admitting: Orthopaedic Surgery

## 2017-11-11 ENCOUNTER — Ambulatory Visit (INDEPENDENT_AMBULATORY_CARE_PROVIDER_SITE_OTHER): Payer: BLUE CROSS/BLUE SHIELD | Admitting: Orthopaedic Surgery

## 2017-11-11 ENCOUNTER — Encounter (INDEPENDENT_AMBULATORY_CARE_PROVIDER_SITE_OTHER): Payer: Self-pay | Admitting: Orthopaedic Surgery

## 2017-11-11 DIAGNOSIS — G8929 Other chronic pain: Secondary | ICD-10-CM | POA: Diagnosis not present

## 2017-11-11 DIAGNOSIS — M5442 Lumbago with sciatica, left side: Secondary | ICD-10-CM | POA: Diagnosis not present

## 2017-11-11 NOTE — Progress Notes (Signed)
Evelyn Nelson - 67 y.o. female MRN [id number]  Date of birth: 12/02/1950  Office Visit Note: Visit Date: 10/28/2017 PCP: Cyndi Bender, PA-C Referred by: Cyndi Bender, PA-C  Subjective: Chief Complaint  Patient presents with  . Lower Back - Pain   HPI: [name] is a 67 year old female who comes in today at the request of [name], P.A.-C for diagnostic and hopefully therapeutic left L4 transforaminal epidural steroid injection.  The patient is status post total hip arthroplasty on the right.  She has been having significant pain in the left groin and left leg.  They have not felt this to be related to her hip.  Repeat MRI of the lumbar spine shows pretty severe spondylosis at L4-5 and L5-S1 with worsening moderate to severe central canal lateral recess stenosis at L4-5.  She also has facet arthropathy at these levels.  We are going to complete a diagnostic and hopefully therapeutic left L4 transforaminal injection.  Consideration should probably be given to diagnostic facet joint block.  Prior left intra-articular hip anesthetic arthrogram did not seem to help very much although during the anesthetic phase did seem to help.  Our notes post injection stated that it helped the groin pain but not the rest of the pain that she is having.   ROS Otherwise per HPI.  Assessment & Plan: Visit Diagnoses:  1. Lumbar radiculopathy   2. Spinal stenosis of lumbar region with neurogenic claudication     Plan: No additional findings.   Meds & Orders:  Meds ordered this encounter  Medications  . betamethasone acetate-betamethasone sodium phosphate (CELESTONE) injection 12 mg    Orders Placed This Encounter  Procedures  . XR C-ARM NO REPORT  . Epidural Steroid injection    Follow-up: Return for Dr. Ninfa Linden.   Procedures: No procedures performed  Lumbosacral Transforaminal Epidural Steroid Injection - Sub-Pedicular Approach with Fluoroscopic Guidance  Patient: Evelyn Nelson      Date  of Birth: 1951/03/23 MRN: [id number] PCP: Cyndi Bender, PA-C      Visit Date: 10/28/2017   Universal Protocol:    Date/Time: 10/28/2017  Consent Given By: the patient  Position: PRONE  Additional Comments: Vital signs were monitored before and after the procedure. Patient was prepped and draped in the usual sterile fashion. The correct patient, procedure, and site was verified.   Injection Procedure Details:  Procedure Site One Meds Administered:  Meds ordered this encounter  Medications  . betamethasone acetate-betamethasone sodium phosphate (CELESTONE) injection 12 mg    Laterality: Left  Location/Site:  L4-L5  Needle size: 22 G  Needle type: Spinal  Needle Placement: Transforaminal  Findings:    -Comments: Excellent flow of contrast along the nerve and into the epidural space.  Procedure Details: After squaring off the end-plates to get a true AP view, the C-arm was positioned so that an oblique view of the foramen as noted above was visualized. The target area is just inferior to the "nose of the scotty dog" or sub pedicular. The soft tissues overlying this structure were infiltrated with 2-3 ml. of 1% Lidocaine without Epinephrine.  The spinal needle was inserted toward the target using a "trajectory" view along the fluoroscope beam.  Under AP and lateral visualization, the needle was advanced so it did not puncture dura and was located close the 6 O'Clock position of the pedical in AP tracterory. Biplanar projections were used to confirm position. Aspiration was confirmed to be negative for CSF and/or blood. A 1-2 ml.  volume of Isovue-250 was injected and flow of contrast was noted at each level. Radiographs were obtained for documentation purposes.   After attaining the desired flow of contrast documented above, a 0.5 to 1.0 ml test dose of 0.25% Marcaine was injected into each respective transforaminal space.  The patient was observed for 90 seconds post  injection.  After no sensory deficits were reported, and normal lower extremity motor function was noted,   the above injectate was administered so that equal amounts of the injectate were placed at each foramen (level) into the transforaminal epidural space.   Additional Comments:  The patient tolerated the procedure well Dressing: Band-Aid    Post-procedure details: Patient was observed during the procedure. Post-procedure instructions were reviewed.  Patient left the clinic in stable condition.    Clinical History: MRI LUMBAR SPINE WITHOUT CONTRAST  TECHNIQUE: Multiplanar, multisequence MR imaging of the lumbar spine was performed. No intravenous contrast was administered.  COMPARISON:  Lumbar radiographs 07/21/2017. [place] [facility] lumbar MRI 05/18/2014  FINDINGS: Segmentation: Normal on the January radiographs which is the same numbering system used on the 2015 MRI.  Alignment: Moderate levoconvex lumbar scoliosis. Relatively preserved lumbar lordosis. Stable vertebral height and alignment since 2015.  Vertebrae: Mild chronic degenerative endplate changes laterally to the left at L5-S1. Background bone marrow signal is normal. No marrow edema or evidence of acute osseous abnormality. Intact visible sacrum and SI joints.  Conus medullaris and cauda equina: Conus extends to the T12-L1 level. Normal visible lower thoracic spinal cord and conus.  Paraspinal and other soft tissues: Negative.  Disc levels:  T11-T12: Chronic disc desiccation and posterior disc bulging. Mild facet hypertrophy. Borderline to mild spinal stenosis may have increased since 2015.  T12-L1:  Mild facet hypertrophy.  No stenosis.  L1-L2:  Mild facet hypertrophy.  No stenosis.  L2-L3: Mild disc desiccation and mostly far lateral disc bulging. Mild to moderate facet hypertrophy. Borderline to mild bilateral L2 foraminal stenosis. This level is stable.  L3-L4: Moderate  circumferential disc bulge eccentric to the right. Moderate to severe facet hypertrophy greater on the right. Degenerative facet joint fluid has regressed since 2015. But mild spinal stenosis with bilateral lateral recess stenosis (descending L4 nerve levels) has increased. Mild left and moderate right L3 foraminal stenosis has increased.  L4-L5: Chronic disc desiccation. Circumferential disc bulge with broad-based posterior and foraminal involvement. Endplate spurring. Moderate to severe facet and ligament flavum hypertrophy. Regressed degenerative right facet joint fluid. Increased spinal stenosis now moderate to severe with similar left lateral recess stenosis (left L5 nerve level). Increased mild to moderate left and moderate right L4 foraminal stenosis.  L5-S1: Chronic circumferential and especially left far lateral disc bulging and endplate spurring. Moderate facet and ligament flavum hypertrophy, greater on the left. Chronic mild to moderate left lateral recess stenosis at the left S1 nerve level appears stable. No spinal or convincing right lateral recess stenosis. Moderate to severe left L5 foraminal stenosis appears stable.  IMPRESSION: 1. Multifactorial lumbar spinal stenosis has progressed since 2015 at both the L3-L4 (mild) and L4-L5 (moderate to severe) levels with associated left lateral recess stenosis at both levels. 2. Moderate to severe left foraminal stenosis at L5-S1 appears stable. 3. Increased moderate to severe right L4 neural foraminal stenosis. 4. Borderline to mild lower thoracic spinal stenosis at T11-T12.   Electronically Signed   By: Genevie Ann M.D.   On: 09/05/2017 15:23   She reports that she has never smoked. She has never used smokeless  tobacco. No results for input(s): HGBA1C, LABURIC in the last 8760 hours.  Objective:  VS:  HT:    WT:   BMI:     BP:(!) 149/99  HR:82bpm  TEMP:99 F (37.2 C)(Oral)  RESP:100 % Physical Exam  Ortho  Exam Imaging: No results found.  Past Medical/Family/Surgical/Social History: Medications & Allergies reviewed per EMR, new medications updated. Patient Active Problem List   Diagnosis Date Noted  . Pain in left hip 08/18/2017  . Left-sided low back pain with left-sided sciatica 08/18/2017  . Left wrist pain 05/18/2016  . Closed fracture of left distal radius and ulna, with routine healing, subsequent encounter 04/17/2016  . Osteoarthritis of right hip 04/09/2015  . Status post total replacement of right hip 04/09/2015  . Osteopenia 03/17/2012   Past Medical History:  Diagnosis Date  . Arthritis    OA, back (epidural injection x2)  & hip  . Complication of anesthesia   . Osteopenia 05/2016   T score -1.9 FRAX 18%/2.8%  . PONV (postoperative nausea and vomiting)    Family History  Problem Relation Age of Onset  . Lung cancer Father   . Hodgkin's lymphoma Son    Past Surgical History:  Procedure Laterality Date  . TONSILLECTOMY    . TOTAL HIP ARTHROPLASTY Right 04/09/2015   Procedure: RIGHT TOTAL HIP ARTHROPLASTY ANTERIOR APPROACH;  Surgeon: Mcarthur Rossetti, MD;  Location: [place];  Service: Orthopedics;  Laterality: Right;  NEEDS RNFA  . TUBAL LIGATION  1984   Social History   Occupational History  . Not on file  Tobacco Use  . Smoking status: Never Smoker  . Smokeless tobacco: Never Used  Substance and Sexual Activity  . Alcohol use: No  . Drug use: No  . Sexual activity: Never

## 2017-11-11 NOTE — Progress Notes (Signed)
The patient is continue to follow-up for her low back pain.  She lasted epidural steroid injections in early May on the ninth by Dr. Ernestina Patches so that is helped quite a bit.  Now her groin pain is not as bad.  She has a history of a right total hip arthroplasty we did 3 years ago but was her left hip was hurting her sounds as well.  On exam she is mobilizing much better.  She does have some left knee pain and weakness but her left hip moves smoothly with no pain in the groin at all.  Her right hip moves smoothly as well.  At this point I do feel she benefit from formal physical therapy to strengthen her back and work on core strengthening exercises as well as any modalities to calm down her pain.  They can also strengthen her knee as well.  I did give her a prescription for physical therapy.  We can see her back in about 6 weeks to see how she is doing overall.  All questions concerns were answered and addressed.

## 2017-11-11 NOTE — Procedures (Signed)
Lumbosacral Transforaminal Epidural Steroid Injection - Sub-Pedicular Approach with Fluoroscopic Guidance  Patient: Evelyn Nelson      Date of Birth: 1950/12/13 MRN: [id number] PCP: Cyndi Bender, PA-C      Visit Date: 10/28/2017   Universal Protocol:    Date/Time: 10/28/2017  Consent Given By: the patient  Position: PRONE  Additional Comments: Vital signs were monitored before and after the procedure. Patient was prepped and draped in the usual sterile fashion. The correct patient, procedure, and site was verified.   Injection Procedure Details:  Procedure Site One Meds Administered:  Meds ordered this encounter  Medications  . betamethasone acetate-betamethasone sodium phosphate (CELESTONE) injection 12 mg    Laterality: Left  Location/Site:  L4-L5  Needle size: 22 G  Needle type: Spinal  Needle Placement: Transforaminal  Findings:    -Comments: Excellent flow of contrast along the nerve and into the epidural space.  Procedure Details: After squaring off the end-plates to get a true AP view, the C-arm was positioned so that an oblique view of the foramen as noted above was visualized. The target area is just inferior to the "nose of the scotty dog" or sub pedicular. The soft tissues overlying this structure were infiltrated with 2-3 ml. of 1% Lidocaine without Epinephrine.  The spinal needle was inserted toward the target using a "trajectory" view along the fluoroscope beam.  Under AP and lateral visualization, the needle was advanced so it did not puncture dura and was located close the 6 O'Clock position of the pedical in AP tracterory. Biplanar projections were used to confirm position. Aspiration was confirmed to be negative for CSF and/or blood. A 1-2 ml. volume of Isovue-250 was injected and flow of contrast was noted at each level. Radiographs were obtained for documentation purposes.   After attaining the desired flow of contrast documented above, a 0.5 to  1.0 ml test dose of 0.25% Marcaine was injected into each respective transforaminal space.  The patient was observed for 90 seconds post injection.  After no sensory deficits were reported, and normal lower extremity motor function was noted,   the above injectate was administered so that equal amounts of the injectate were placed at each foramen (level) into the transforaminal epidural space.   Additional Comments:  The patient tolerated the procedure well Dressing: Band-Aid    Post-procedure details: Patient was observed during the procedure. Post-procedure instructions were reviewed.  Patient left the clinic in stable condition.

## 2017-12-22 ENCOUNTER — Encounter (INDEPENDENT_AMBULATORY_CARE_PROVIDER_SITE_OTHER): Payer: Self-pay | Admitting: Orthopaedic Surgery

## 2017-12-22 ENCOUNTER — Ambulatory Visit (INDEPENDENT_AMBULATORY_CARE_PROVIDER_SITE_OTHER): Payer: BLUE CROSS/BLUE SHIELD | Admitting: Orthopaedic Surgery

## 2017-12-22 DIAGNOSIS — M25552 Pain in left hip: Secondary | ICD-10-CM | POA: Diagnosis not present

## 2017-12-22 NOTE — Progress Notes (Signed)
The patient is well-known to Evelyn Nelson.  She is been dealing with significant lumbar spine issues but also left hip pain.  We actually performed a right total hip arthroplasty on her sometime around 2016.  That is done wonderful for her.  More recently in May of this year we had her set up for a left-sided epidural steroid injection at L4-L5.  That is helped her as well as physical therapy however she starting to have worsening left hip groin pain that radiates down to her knee and her inner thigh area.  She says this is how it felt on her right side prior to ending up needing a right hip replacement.  I was able to scrutinize previous films from January of this year of her left hip.  To her left hip films from 2012.  There is noticeable joint space narrowing.  On exam she does have pain with internal rotation of the left hip seems to be in her groin.  At this point I want physical therapy but it is absolutely necessary we obtain an MRI of the left hip to assess the cartilage.  Her neurosurgeon is not felt in the past that her lumbar spine issues warranted any type of surgery and he felt this is potentially more of a hip issue as well.  Now seeing how she walks and seeing her mobility I feel this may be more of a hip issue as well.  I do feel the MRI is warranted to further assess the cause of the hip to help Evelyn Nelson make decision whether or not she needs a hip replacement.  Of note she has had intra-articular steroid injections in her left hip as well that did not help at all.

## 2017-12-27 ENCOUNTER — Other Ambulatory Visit (INDEPENDENT_AMBULATORY_CARE_PROVIDER_SITE_OTHER): Payer: Self-pay

## 2017-12-27 DIAGNOSIS — M25552 Pain in left hip: Secondary | ICD-10-CM

## 2018-01-02 ENCOUNTER — Ambulatory Visit
Admission: RE | Admit: 2018-01-02 | Discharge: 2018-01-02 | Disposition: A | Discharge status: Home / Self Care | Payer: BLUE CROSS/BLUE SHIELD | Source: Ambulatory Visit | Attending: Orthopaedic Surgery | Admitting: Orthopaedic Surgery

## 2018-01-02 DIAGNOSIS — M25552 Pain in left hip: Secondary | ICD-10-CM

## 2018-01-10 ENCOUNTER — Encounter (INDEPENDENT_AMBULATORY_CARE_PROVIDER_SITE_OTHER): Payer: Self-pay | Admitting: Orthopaedic Surgery

## 2018-01-10 ENCOUNTER — Ambulatory Visit (INDEPENDENT_AMBULATORY_CARE_PROVIDER_SITE_OTHER): Payer: BLUE CROSS/BLUE SHIELD | Admitting: Orthopaedic Surgery

## 2018-01-10 DIAGNOSIS — M1612 Unilateral primary osteoarthritis, left hip: Secondary | ICD-10-CM | POA: Diagnosis not present

## 2018-01-10 NOTE — Progress Notes (Signed)
The patient is a 67 year old female well-known to me.  She is here for follow-up after having an MRI of her left hip.  She has a history of a right total hip arthroplasty.  She was having worsening left hip pain but the x-rays were not conclusive in terms of arthritic changes.  She had an intra-articular injection of a steroid in that left hip and that did ease the pain just a little bit.  She is been going to physical therapy already as well as an outpatient for her hip.  The MRI is reviewed with her and her daughter.  She has severe end-stage arthritis of the left hip.  There is a large area of cartilage loss completely of the femoral head and acetabulum.  There is cystic changes of both acetabular and femoral head side.  There is also a degenerative labral tear.  Her left hip has significant pain on internal and external rotation.  Her right operative hip from a total hip arthroplasty several years ago moves normal.  At this point given her daily pain and how this is detrimentally affect her activities of living, her quality life, mobility she would like to proceed with left total hip arthroplasty in the near future.  We will work on coordinating this with her daughter schedule who is also an Financial trader at [employer].  We will then see her back in 2 weeks postoperative.  All questions concerns were answered and addressed.

## 2018-02-15 ENCOUNTER — Telehealth (INDEPENDENT_AMBULATORY_CARE_PROVIDER_SITE_OTHER): Payer: Self-pay | Admitting: Orthopaedic Surgery

## 2018-02-15 NOTE — Telephone Encounter (Signed)
Angie, patients daughter requesting a handicap placard since she will be having surgery soon. Call Angie and she can pick up form # [phone]

## 2018-02-15 NOTE — Telephone Encounter (Signed)
Daughter aware handicap paper ready at front desk

## 2018-02-15 NOTE — Telephone Encounter (Signed)
Ok

## 2018-02-15 NOTE — Telephone Encounter (Signed)
That will be fine. 

## 2018-02-16 ENCOUNTER — Encounter (INDEPENDENT_AMBULATORY_CARE_PROVIDER_SITE_OTHER): Payer: Self-pay

## 2018-02-16 ENCOUNTER — Telehealth (INDEPENDENT_AMBULATORY_CARE_PROVIDER_SITE_OTHER): Payer: Self-pay | Admitting: Orthopaedic Surgery

## 2018-02-16 NOTE — Telephone Encounter (Signed)
Patients daughter called and is requesting for Dr. Ninfa Linden to write patient out of work starting today, she is still working full time and she is in so much pain she doesn't think she can work anymore before her surgery scheduled for 9/17. Angie would like to pick this note up the same time as handicap placard. Please call her once ready #  [phone]

## 2018-02-16 NOTE — Telephone Encounter (Signed)
Patient aware handicap and letter ready

## 2018-02-24 ENCOUNTER — Other Ambulatory Visit (INDEPENDENT_AMBULATORY_CARE_PROVIDER_SITE_OTHER): Payer: Self-pay | Admitting: Physician Assistant

## 2018-03-02 NOTE — Pre-Procedure Instructions (Signed)
Evelyn Nelson  03/02/2018      [id number] [address], [place] [address] Phone: [phone] Fax: [place] [place] [address] Phone: [phone] Fax: [phone]    Your procedure is scheduled on   Tuesday 03/08/18  Report to [facility] Admitting at 1100 A.M.  Call this number if you have problems the morning of surgery:  [phone]   Remember:  Do not eat or drink after midnight.      Take these medicines the morning of surgery with A SIP OF WATER  -  NONE   7 days prior to surgery STOP taking any Aspirin(unless otherwise instructed by your surgeon), Aleve, Naproxen, Ibuprofen, Motrin, Advil, Goody's, BC's, all herbal medications, fish oil, and all vitamins    Do not wear jewelry, make-up or nail polish.  Do not wear lotions, powders, or perfumes, or deodorant.  Do not shave 48 hours prior to surgery.  Men may shave face and neck.  Do not bring valuables to the hospital.  [facility] is not responsible for any belongings or valuables.  Contacts, dentures or bridgework may not be worn into surgery.  Leave your suitcase in the car.  After surgery it may be brought to your room.  For patients admitted to the hospital, discharge time will be determined by your treatment team.  Patients discharged the day of surgery will not be allowed to drive home.   Name and phone number of your driver:    Special instructions:  [facility] - Preparing for Surgery  Before surgery, you can play an important role.  Because skin is not sterile, your skin needs to be as free of germs as possible.  You can reduce the number of germs on you skin by washing with CHG (chlorahexidine gluconate) soap before surgery.  CHG is an antiseptic cleaner which kills germs and bonds with the skin to continue killing germs  even after washing.  Oral Hygiene is also important in reducing the risk of infection.  Remember to brush your teeth with your regular toothpaste the morning of surgery.  Please DO NOT use if you have an allergy to CHG or antibacterial soaps.  If your skin becomes reddened/irritated stop using the CHG and inform your nurse when you arrive at Short Stay.  Do not shave (including legs and underarms) for at least 48 hours prior to the first CHG shower.  You may shave your face.  Please follow these instructions carefully:   1.  Shower with CHG Soap the night before surgery and the morning of Surgery.  2.  If you choose to wash your hair, wash your hair first as usual with your normal shampoo.  3.  After you shampoo, rinse your hair and body thoroughly to remove the shampoo. 4.  Use CHG as you would any other liquid soap.  You can apply chg directly to the skin and wash gently with a      scrungie or washcloth.           5.  Apply the CHG Soap to your body ONLY FROM THE NECK DOWN.   Do not use on open wounds or open sores. Avoid contact with your eyes, ears, mouth and genitals (private parts).  Wash genitals (private parts) with your normal soap.  6.  Wash thoroughly,  paying special attention to the area where your surgery will be performed.  7.  Thoroughly rinse your body with warm water from the neck down.  8.  DO NOT shower/wash with your normal soap after using and rinsing off the CHG Soap.  9.  Pat yourself dry with a clean towel.            10.  Wear clean pajamas.            11.  Place clean sheets on your bed the night of your first shower and do not sleep with pets.  Day of Surgery  Do not apply any lotions/deoderants the morning of surgery.   Please wear clean clothes to the hospital/surgery center. Remember to brush your teeth with toothpaste.     Please read over the following fact sheets that you were given. Pain Booklet, MRSA Information and Surgical Site Infection  Prevention

## 2018-03-03 ENCOUNTER — Encounter (HOSPITAL_COMMUNITY): Payer: Self-pay

## 2018-03-03 ENCOUNTER — Encounter (HOSPITAL_COMMUNITY)
Admission: RE | Admit: 2018-03-03 | Discharge: 2018-03-03 | Disposition: A | Discharge status: Home / Self Care | Payer: BLUE CROSS/BLUE SHIELD | Source: Ambulatory Visit | Attending: Orthopaedic Surgery | Admitting: Orthopaedic Surgery

## 2018-03-03 DIAGNOSIS — Z01812 Encounter for preprocedural laboratory examination: Secondary | ICD-10-CM | POA: Insufficient documentation

## 2018-03-03 DIAGNOSIS — M1612 Unilateral primary osteoarthritis, left hip: Secondary | ICD-10-CM | POA: Diagnosis not present

## 2018-03-03 LAB — CBC
HCT: 36.5 % (ref 36.0–46.0)
Hemoglobin: 12 g/dL (ref 12.0–15.0)
MCH: 31.2 pg (ref 26.0–34.0)
MCHC: 32.9 g/dL (ref 30.0–36.0)
MCV: 94.8 fL (ref 78.0–100.0)
Platelets: 293 10*3/uL (ref 150–400)
RBC: 3.85 MIL/uL — ABNORMAL LOW (ref 3.87–5.11)
RDW: 12.6 % (ref 11.5–15.5)
WBC: 6.9 10*3/uL (ref 4.0–10.5)

## 2018-03-03 LAB — BASIC METABOLIC PANEL
Anion gap: 11 (ref 5–15)
BUN: 10 mg/dL (ref 8–23)
CO2: 26 mmol/L (ref 22–32)
Calcium: 9.7 mg/dL (ref 8.9–10.3)
Chloride: 98 mmol/L (ref 98–111)
Creatinine, Ser: 0.8 mg/dL (ref 0.44–1.00)
GFR calc Af Amer: >60 mL/min (ref >60)
GFR calc non Af Amer: >60 mL/min (ref >60)
Glucose, Bld: 86 mg/dL (ref 70–99)
Potassium: 4.3 mmol/L (ref 3.5–5.1)
Sodium: 135 mmol/L (ref 135–145)

## 2018-03-03 LAB — SURGICAL PCR SCREEN
MRSA, PCR: NEGATIVE
Staphylococcus aureus: NEGATIVE

## 2018-03-07 MED ORDER — TRANEXAMIC ACID 1000 MG/10ML IV SOLN
1000.0000 mg | INTRAVENOUS | Status: DC
  Filled 2018-03-07: qty 10

## 2018-03-08 ENCOUNTER — Encounter (HOSPITAL_COMMUNITY): Payer: Self-pay | Admitting: *Deleted

## 2018-03-08 ENCOUNTER — Encounter (HOSPITAL_COMMUNITY)
Admission: RE | Disposition: A | Discharge status: Home Health | Payer: Self-pay | Source: Ambulatory Visit | Attending: Orthopaedic Surgery

## 2018-03-08 ENCOUNTER — Inpatient Hospital Stay (HOSPITAL_COMMUNITY): Payer: BLUE CROSS/BLUE SHIELD

## 2018-03-08 ENCOUNTER — Inpatient Hospital Stay (HOSPITAL_COMMUNITY)
Admission: RE | Admit: 2018-03-08 | Discharge: 2018-03-10 | DRG: 470 | Disposition: A | Discharge status: Home Health | Payer: BLUE CROSS/BLUE SHIELD | Source: Ambulatory Visit | Attending: Orthopaedic Surgery | Admitting: Orthopaedic Surgery

## 2018-03-08 ENCOUNTER — Other Ambulatory Visit: Payer: Self-pay

## 2018-03-08 ENCOUNTER — Inpatient Hospital Stay (HOSPITAL_COMMUNITY): Payer: BLUE CROSS/BLUE SHIELD | Admitting: Certified Registered"

## 2018-03-08 DIAGNOSIS — Z9181 History of falling: Secondary | ICD-10-CM

## 2018-03-08 DIAGNOSIS — Z96642 Presence of left artificial hip joint: Secondary | ICD-10-CM

## 2018-03-08 DIAGNOSIS — Z419 Encounter for procedure for purposes other than remedying health state, unspecified: Secondary | ICD-10-CM

## 2018-03-08 DIAGNOSIS — Z23 Encounter for immunization: Secondary | ICD-10-CM | POA: Diagnosis not present

## 2018-03-08 DIAGNOSIS — D62 Acute posthemorrhagic anemia: Secondary | ICD-10-CM | POA: Diagnosis not present

## 2018-03-08 DIAGNOSIS — M8580 Other specified disorders of bone density and structure, unspecified site: Secondary | ICD-10-CM | POA: Diagnosis present

## 2018-03-08 DIAGNOSIS — Z96641 Presence of right artificial hip joint: Secondary | ICD-10-CM | POA: Diagnosis present

## 2018-03-08 DIAGNOSIS — M1612 Unilateral primary osteoarthritis, left hip: Secondary | ICD-10-CM | POA: Diagnosis present

## 2018-03-08 HISTORY — PX: TOTAL HIP ARTHROPLASTY: SHX124

## 2018-03-08 SURGERY — ARTHROPLASTY, HIP, TOTAL, ANTERIOR APPROACH
Anesthesia: General | Site: Hip | Laterality: Left

## 2018-03-08 MED ORDER — POLYETHYLENE GLYCOL 3350 17 G PO PACK: 17.0000 g | PACK | Freq: Every day | ORAL | Status: DC | PRN

## 2018-03-08 MED ORDER — HYDROMORPHONE HCL 1 MG/ML IJ SOLN: 0.5000 mg | INTRAMUSCULAR | Status: DC | PRN

## 2018-03-08 MED ORDER — FENTANYL CITRATE (PF) 250 MCG/5ML IJ SOLN
INTRAMUSCULAR | Status: DC | PRN
  Administered 2018-03-08: 25 ug via INTRAVENOUS
  Administered 2018-03-08 (×2): 50 ug via INTRAVENOUS

## 2018-03-08 MED ORDER — OXYCODONE HCL 5 MG PO TABS
5.0000 mg | ORAL_TABLET | ORAL | Status: DC | PRN
  Administered 2018-03-09: 5 mg via ORAL
  Filled 2018-03-08: qty 1

## 2018-03-08 MED ORDER — MENTHOL 3 MG MT LOZG: 1.0000 | LOZENGE | OROMUCOSAL | Status: DC | PRN

## 2018-03-08 MED ORDER — PROMETHAZINE HCL 25 MG/ML IJ SOLN: 6.2500 mg | INTRAMUSCULAR | Status: DC | PRN

## 2018-03-08 MED ORDER — LACTATED RINGERS IV SOLN: INTRAVENOUS | Status: DC | PRN

## 2018-03-08 MED ORDER — CEFAZOLIN SODIUM-DEXTROSE 1-4 GM/50ML-% IV SOLN
1.0000 g | Freq: Four times a day (QID) | INTRAVENOUS | Status: AC
  Administered 2018-03-08 (×2): 1 g via INTRAVENOUS
  Filled 2018-03-08 (×2): qty 50

## 2018-03-08 MED ORDER — PROPOFOL 10 MG/ML IV BOLUS
INTRAVENOUS | Status: AC
  Filled 2018-03-08: qty 20

## 2018-03-08 MED ORDER — 0.9 % SODIUM CHLORIDE (POUR BTL) OPTIME
TOPICAL | Status: DC | PRN
  Administered 2018-03-08: 1000 mL

## 2018-03-08 MED ORDER — SODIUM CHLORIDE 0.9 % IR SOLN
Status: DC | PRN
  Administered 2018-03-08: 3000 mL

## 2018-03-08 MED ORDER — BUPIVACAINE IN DEXTROSE 0.75-8.25 % IT SOLN
INTRATHECAL | Status: DC | PRN
  Administered 2018-03-08: 13.5 mg via INTRATHECAL

## 2018-03-08 MED ORDER — PROPOFOL 10 MG/ML IV BOLUS
INTRAVENOUS | Status: DC | PRN
  Administered 2018-03-08: 10 mg via INTRAVENOUS
  Administered 2018-03-08: 100 mg via INTRAVENOUS
  Administered 2018-03-08: 20 mg via INTRAVENOUS

## 2018-03-08 MED ORDER — METOCLOPRAMIDE HCL 5 MG PO TABS: 5.0000 mg | ORAL_TABLET | Freq: Three times a day (TID) | ORAL | Status: DC | PRN

## 2018-03-08 MED ORDER — CEFAZOLIN SODIUM-DEXTROSE 2-4 GM/100ML-% IV SOLN
2.0000 g | INTRAVENOUS | Status: AC
  Administered 2018-03-08: 2 g via INTRAVENOUS

## 2018-03-08 MED ORDER — VITAMIN D 1000 UNITS PO TABS
1000.0000 [IU] | ORAL_TABLET | Freq: Every day | ORAL | Status: DC
  Administered 2018-03-08 – 2018-03-10 (×3): 1000 [IU] via ORAL
  Filled 2018-03-08 (×6): qty 1

## 2018-03-08 MED ORDER — PANTOPRAZOLE SODIUM 40 MG PO TBEC
40.0000 mg | DELAYED_RELEASE_TABLET | Freq: Every day | ORAL | Status: DC
  Administered 2018-03-08 – 2018-03-10 (×3): 40 mg via ORAL
  Filled 2018-03-08 (×3): qty 1

## 2018-03-08 MED ORDER — ASPIRIN 81 MG PO CHEW
81.0000 mg | CHEWABLE_TABLET | Freq: Two times a day (BID) | ORAL | Status: DC
  Administered 2018-03-08 – 2018-03-10 (×4): 81 mg via ORAL
  Filled 2018-03-08 (×4): qty 1

## 2018-03-08 MED ORDER — MIDAZOLAM HCL 2 MG/2ML IJ SOLN
INTRAMUSCULAR | Status: DC | PRN
  Administered 2018-03-08: 2 mg via INTRAVENOUS

## 2018-03-08 MED ORDER — MIDAZOLAM HCL 2 MG/2ML IJ SOLN
INTRAMUSCULAR | Status: AC
  Filled 2018-03-08: qty 2

## 2018-03-08 MED ORDER — DEXAMETHASONE SODIUM PHOSPHATE 10 MG/ML IJ SOLN
INTRAMUSCULAR | Status: AC
  Filled 2018-03-08: qty 1

## 2018-03-08 MED ORDER — METHOCARBAMOL 1000 MG/10ML IJ SOLN
500.0000 mg | Freq: Four times a day (QID) | INTRAVENOUS | Status: DC | PRN
  Filled 2018-03-08: qty 5

## 2018-03-08 MED ORDER — LACTATED RINGERS IV SOLN
INTRAVENOUS | Status: DC | PRN
  Administered 2018-03-08 (×2): via INTRAVENOUS

## 2018-03-08 MED ORDER — METHOCARBAMOL 500 MG PO TABS: 500.0000 mg | ORAL_TABLET | Freq: Four times a day (QID) | ORAL | Status: DC | PRN

## 2018-03-08 MED ORDER — DOCUSATE SODIUM 100 MG PO CAPS
100.0000 mg | ORAL_CAPSULE | Freq: Two times a day (BID) | ORAL | Status: DC
  Administered 2018-03-08 – 2018-03-10 (×5): 100 mg via ORAL
  Filled 2018-03-08 (×5): qty 1

## 2018-03-08 MED ORDER — MIDAZOLAM HCL 2 MG/2ML IJ SOLN: 0.5000 mg | Freq: Once | INTRAMUSCULAR | Status: DC | PRN

## 2018-03-08 MED ORDER — ALUM & MAG HYDROXIDE-SIMETH 200-200-20 MG/5ML PO SUSP: 30.0000 mL | ORAL | Status: DC | PRN

## 2018-03-08 MED ORDER — EPHEDRINE SULFATE 50 MG/ML IJ SOLN
INTRAMUSCULAR | Status: DC | PRN
  Administered 2018-03-08: 10 mg via INTRAVENOUS
  Administered 2018-03-08: 5 mg via INTRAVENOUS

## 2018-03-08 MED ORDER — BISACODYL 10 MG RE SUPP: 10.0000 mg | Freq: Every day | RECTAL | Status: DC | PRN

## 2018-03-08 MED ORDER — ONDANSETRON HCL 4 MG/2ML IJ SOLN: 4.0000 mg | Freq: Four times a day (QID) | INTRAMUSCULAR | Status: DC | PRN

## 2018-03-08 MED ORDER — CEFAZOLIN SODIUM-DEXTROSE 2-4 GM/100ML-% IV SOLN
INTRAVENOUS | Status: AC
  Filled 2018-03-08: qty 100

## 2018-03-08 MED ORDER — SCOPOLAMINE 1 MG/3DAYS TD PT72
MEDICATED_PATCH | TRANSDERMAL | Status: DC | PRN
  Administered 2018-03-08: 1 via TRANSDERMAL

## 2018-03-08 MED ORDER — FENTANYL CITRATE (PF) 250 MCG/5ML IJ SOLN
INTRAMUSCULAR | Status: AC
  Filled 2018-03-08: qty 5

## 2018-03-08 MED ORDER — MEPERIDINE HCL 50 MG/ML IJ SOLN: 6.2500 mg | INTRAMUSCULAR | Status: DC | PRN

## 2018-03-08 MED ORDER — PHENOL 1.4 % MT LIQD: 1.0000 | OROMUCOSAL | Status: DC | PRN

## 2018-03-08 MED ORDER — DEXAMETHASONE SODIUM PHOSPHATE 10 MG/ML IJ SOLN
INTRAMUSCULAR | Status: DC | PRN
  Administered 2018-03-08: 10 mg via INTRAVENOUS

## 2018-03-08 MED ORDER — ACETAMINOPHEN 325 MG PO TABS
325.0000 mg | ORAL_TABLET | Freq: Four times a day (QID) | ORAL | Status: DC | PRN
  Administered 2018-03-08 – 2018-03-10 (×7): 650 mg via ORAL
  Filled 2018-03-08 (×7): qty 2

## 2018-03-08 MED ORDER — PROPOFOL 500 MG/50ML IV EMUL
INTRAVENOUS | Status: DC | PRN
  Administered 2018-03-08: 50 ug/kg/min via INTRAVENOUS

## 2018-03-08 MED ORDER — ONDANSETRON HCL 4 MG/2ML IJ SOLN
INTRAMUSCULAR | Status: AC
  Filled 2018-03-08: qty 2

## 2018-03-08 MED ORDER — HYDROMORPHONE HCL 1 MG/ML IJ SOLN
0.2500 mg | INTRAMUSCULAR | Status: DC | PRN
  Administered 2018-03-08 (×2): 0.25 mg via INTRAVENOUS

## 2018-03-08 MED ORDER — ONDANSETRON HCL 4 MG PO TABS: 4.0000 mg | ORAL_TABLET | Freq: Four times a day (QID) | ORAL | Status: DC | PRN

## 2018-03-08 MED ORDER — HYDROMORPHONE HCL 1 MG/ML IJ SOLN
INTRAMUSCULAR | Status: AC
  Administered 2018-03-08: 0.25 mg via INTRAVENOUS
  Filled 2018-03-08: qty 1

## 2018-03-08 MED ORDER — SCOPOLAMINE 1 MG/3DAYS TD PT72
MEDICATED_PATCH | TRANSDERMAL | Status: AC
  Filled 2018-03-08: qty 1

## 2018-03-08 MED ORDER — ONDANSETRON HCL 4 MG/2ML IJ SOLN
INTRAMUSCULAR | Status: DC | PRN
  Administered 2018-03-08: 4 mg via INTRAVENOUS

## 2018-03-08 MED ORDER — METOCLOPRAMIDE HCL 5 MG/ML IJ SOLN: 5.0000 mg | Freq: Three times a day (TID) | INTRAMUSCULAR | Status: DC | PRN

## 2018-03-08 MED ORDER — OXYCODONE HCL 5 MG PO TABS: 10.0000 mg | ORAL_TABLET | ORAL | Status: DC | PRN

## 2018-03-08 MED ORDER — DIPHENHYDRAMINE HCL 12.5 MG/5ML PO ELIX: 12.5000 mg | ORAL_SOLUTION | ORAL | Status: DC | PRN

## 2018-03-08 MED ORDER — CHLORHEXIDINE GLUCONATE 4 % EX LIQD: 60.0000 mL | Freq: Once | CUTANEOUS | Status: DC

## 2018-03-08 MED ORDER — SODIUM CHLORIDE 0.9 % IV SOLN
INTRAVENOUS | Status: DC
  Administered 2018-03-08: 16:00:00 via INTRAVENOUS

## 2018-03-08 MED ORDER — SODIUM CHLORIDE 0.9 % IV SOLN
INTRAVENOUS | Status: DC | PRN
  Administered 2018-03-08: 25 ug/min via INTRAVENOUS

## 2018-03-08 SURGICAL SUPPLY — 57 items
APL SKNCLS STERI-STRIP NONHPOA (GAUZE/BANDAGES/DRESSINGS) ×1
BENZOIN TINCTURE PRP APPL 2/3 (GAUZE/BANDAGES/DRESSINGS) ×2 IMPLANT
BLADE CLIPPER SURG (BLADE) IMPLANT
BLADE SAW SGTL 18X1.27X75 (BLADE) ×2 IMPLANT
CELLS DAT CNTRL 66122 CELL SVR (MISCELLANEOUS) ×1 IMPLANT
CLSR STERI-STRIP ANTIMIC 1/2X4 (GAUZE/BANDAGES/DRESSINGS) ×2 IMPLANT
COVER SURGICAL LIGHT HANDLE (MISCELLANEOUS) ×2 IMPLANT
CUP SECTOR GRIPTON 50MM (Cup) ×2 IMPLANT
DRAPE C-ARM 42X72 X-RAY (DRAPES) ×2 IMPLANT
DRAPE STERI IOBAN 125X83 (DRAPES) ×2 IMPLANT
DRAPE U-SHAPE 47X51 STRL (DRAPES) ×6 IMPLANT
DRSG AQUACEL AG ADV 3.5X 6 (GAUZE/BANDAGES/DRESSINGS) ×2 IMPLANT
DRSG AQUACEL AG ADV 3.5X10 (GAUZE/BANDAGES/DRESSINGS) ×2 IMPLANT
DURAPREP 26ML APPLICATOR (WOUND CARE) ×2 IMPLANT
ELECT BLADE 4.0 EZ CLEAN MEGAD (MISCELLANEOUS) ×2
ELECT BLADE 6.5 EXT (BLADE) IMPLANT
ELECT REM PT RETURN 9FT ADLT (ELECTROSURGICAL) ×2
ELECTRODE BLDE 4.0 EZ CLN MEGD (MISCELLANEOUS) ×1 IMPLANT
ELECTRODE REM PT RTRN 9FT ADLT (ELECTROSURGICAL) ×1 IMPLANT
FACESHIELD WRAPAROUND (MASK) ×4 IMPLANT
GLOVE BIOGEL PI IND STRL 8 (GLOVE) ×2 IMPLANT
GLOVE BIOGEL PI INDICATOR 8 (GLOVE) ×2
GLOVE ECLIPSE 8.0 STRL XLNG CF (GLOVE) ×2 IMPLANT
GLOVE ORTHO TXT STRL SZ7.5 (GLOVE) ×4 IMPLANT
GOWN STRL REUS W/ TWL LRG LVL3 (GOWN DISPOSABLE) ×2 IMPLANT
GOWN STRL REUS W/ TWL XL LVL3 (GOWN DISPOSABLE) ×2 IMPLANT
GOWN STRL REUS W/TWL LRG LVL3 (GOWN DISPOSABLE) ×4
GOWN STRL REUS W/TWL XL LVL3 (GOWN DISPOSABLE) ×4
HANDPIECE INTERPULSE COAX TIP (DISPOSABLE) ×2
HEAD FEM STD 32X+5 STRL (Hips) ×2 IMPLANT
HEAD FEMORAL 32 CERAMIC (Hips) ×2 IMPLANT
KIT BASIN OR (CUSTOM PROCEDURE TRAY) ×2 IMPLANT
KIT TURNOVER KIT B (KITS) ×2 IMPLANT
LINER ACETABULAR 32X50 (Liner) ×1 IMPLANT
MANIFOLD NEPTUNE II (INSTRUMENTS) ×2 IMPLANT
NS IRRIG 1000ML POUR BTL (IV SOLUTION) ×2 IMPLANT
PACK TOTAL JOINT (CUSTOM PROCEDURE TRAY) ×2 IMPLANT
PAD ARMBOARD 7.5X6 YLW CONV (MISCELLANEOUS) ×2 IMPLANT
RTRCTR WOUND ALEXIS 18CM MED (MISCELLANEOUS) ×2
SCREW 6.5MMX25MM (Screw) ×1 IMPLANT
SET HNDPC FAN SPRY TIP SCT (DISPOSABLE) ×1 IMPLANT
STAPLER VISISTAT 35W (STAPLE) IMPLANT
STEM CORAIL KLA12 (Stem) ×2 IMPLANT
STRIP CLOSURE SKIN 1/2X4 (GAUZE/BANDAGES/DRESSINGS) ×4 IMPLANT
SUT ETHIBOND NAB CT1 #1 30IN (SUTURE) ×2 IMPLANT
SUT MNCRL AB 4-0 PS2 18 (SUTURE) IMPLANT
SUT VIC AB 0 CT1 27 (SUTURE) ×2
SUT VIC AB 0 CT1 27XBRD ANBCTR (SUTURE) ×1 IMPLANT
SUT VIC AB 1 CT1 27 (SUTURE) ×2
SUT VIC AB 1 CT1 27XBRD ANBCTR (SUTURE) ×1 IMPLANT
SUT VIC AB 2-0 CT1 27 (SUTURE) ×2
SUT VIC AB 2-0 CT1 TAPERPNT 27 (SUTURE) ×1 IMPLANT
TOWEL OR 17X24 6PK STRL BLUE (TOWEL DISPOSABLE) ×2 IMPLANT
TOWEL OR 17X26 10 PK STRL BLUE (TOWEL DISPOSABLE) ×2 IMPLANT
TRAY CATH 16FR W/PLASTIC CATH (SET/KITS/TRAYS/PACK) IMPLANT
TRAY FOLEY MTR SLVR 16FR STAT (SET/KITS/TRAYS/PACK) IMPLANT
WATER STERILE IRR 1000ML POUR (IV SOLUTION) ×4 IMPLANT

## 2018-03-08 NOTE — Transfer of Care (Signed)
Immediate Anesthesia Transfer of Care Note  Patient: Evelyn Nelson  Procedure(s) Performed: LEFT TOTAL HIP ARTHROPLASTY ANTERIOR APPROACH (Left Hip)  Patient Location: PACU  Anesthesia Type:General and Spinal  Level of Consciousness: awake, alert  and oriented  Airway & Oxygen Therapy: Patient Spontanous Breathing  Post-op Assessment: Report given to RN and Patient moving all extremities X 4  Post vital signs: Reviewed and stable  Last Vitals:  Vitals Value Taken Time  BP 105/70 03/08/2018  2:28 PM  Temp 36.3 C 03/08/2018  2:28 PM  Pulse 68 03/08/2018  2:28 PM  Resp 18 03/08/2018  2:28 PM  SpO2 98 % 03/08/2018  2:28 PM    Last Pain:  Vitals:   03/08/18 1021  TempSrc: Oral  PainSc: 0-No pain      Patients Stated Pain Goal: 3 (67/34/19 3790)  Complications: No apparent anesthesia complications

## 2018-03-08 NOTE — Anesthesia Postprocedure Evaluation (Signed)
Anesthesia Post Note  Patient: DAWNETTA COPENHAVER  Procedure(s) Performed: LEFT TOTAL HIP ARTHROPLASTY ANTERIOR APPROACH (Left Hip)     Patient location during evaluation: PACU Anesthesia Type: Spinal and General Level of consciousness: awake and alert, oriented and patient cooperative Pain management: pain level controlled Vital Signs Assessment: post-procedure vital signs reviewed and stable Respiratory status: spontaneous breathing, nonlabored ventilation and respiratory function stable Cardiovascular status: blood pressure returned to baseline and stable Postop Assessment: no apparent nausea or vomiting, spinal receding, patient able to bend at knees and adequate PO intake Anesthetic complications: no    Last Vitals:  Vitals:   03/08/18 1458 03/08/18 1526  BP: (!) 125/57 (!) 145/70  Pulse: 67 66  Resp: 15 17  Temp: (!) 36.2 C   SpO2: 100% 100%    Last Pain:  Vitals:   03/08/18 1449  TempSrc:   PainSc: 3                  Jarmarcus Wambold,E. Haille Pardi

## 2018-03-08 NOTE — Op Note (Signed)
NAME: Evelyn Nelson, LUEPKE MEDICAL RECORD BJ:[id number] ACCOUNT [id number] DATE OF BIRTH:1951-02-10 FACILITY: [facility] LOCATION: [facility] PHYSICIAN:Kaeley Vinje Kerry Fort, MD  OPERATIVE REPORT  DATE OF PROCEDURE:  03/08/2018  PREOPERATIVE DIAGNOSIS:  Primary osteoarthritis and degenerative joint disease, left hip.  POSTOPERATIVE DIAGNOSIS:  Primary osteoarthritis and degenerative joint disease, left hip.  PROCEDURE:  Left total hip arthroplasty through direct anterior approach.  IMPLANTS:  DePuy Sector Gription acetabular component size 50, with a single screw, size 32+0 neutral polyethylene liner, size 12 Corail femoral component with varus offset, size 32+5 metal hip ball.  SURGEON:  Lind Guest. Ninfa Linden, MD  ASSISTANT:  Erskine Emery, PA-C.  ANESTHESIA: 1.  Attempted spinal. 2.  General.  ANTIBIOTICS:  2 grams IV Ancef.  ESTIMATED BLOOD LOSS:  213 mL  COMPLICATIONS:  None.  INDICATIONS:  The patient is a very pleasant and active 67 year old female well known to me.  She has had a previous right total hip arthroplasty performed in 2016 and did very well.  Her left hip started hurting her last year and we felt that it may  have been a spine issue.  Her plain films did not show any significant deformity from her hip, but after continued pain and failed conservative treatment, an MRI was obtained.  An MRI showed severe osteoarthritis of her left hip.  Her physical exam is  definitely consistent with that as well and her pain is worsening.  At this point, it is detrimentally affecting her activities of daily living, mobility, her quality of life.  She does wish to proceed with total hip arthroplasty at this standpoint and I  agree with this as well.  Having had this done, she fully understands the risk of acute blood loss anemia, nerve or vessel injury, fracture, infection, dislocation, DVT and implant failure.  She understands her goals are to decrease pain, improve  mobility and overall  improve quality of life.  DESCRIPTION OF PROCEDURE:  After informed consent was obtained, the appropriate left hip was marked.  She was brought to the operating room and sat up on a stretcher where spinal anesthesia was obtained, a Foley catheter was placed and both feet had  traction boots applied to them.  Next, she was placed supine on the Hana fracture table with a perineal post in place and both legs in line skeletal traction device and no traction applied.  Her left operative hip was prepped and draped with DuraPrep and  sterile drapes.  A time-out was called to identify correct patient, correct left hip.  We then tested the skin with pinching very hard and she was actually feeling the pinching so they had to actually place an LMA with general anesthesia.  We then  proceeded with the case and made an incision just inferior and posterior to the anterior superior iliac spine and carried this obliquely down the leg.  I dissected down tensor fascia lata muscle.  Tensor fascia was then divided longitudinally to proceed  with direct anterior approach to the hip.  We identified and cauterized circumflex vessels.  I then identified the hip capsule, opened the hip capsule in L-type format finding a large joint effusion and significant arthritis around her left hip.  I  placed Cobra retractors around the medial and lateral femoral neck and made our femoral neck cut with an oscillating saw and completed this with an osteotome.  We placed a corkscrew down the femoral head and removed the femoral head in its entirety and  found a wide area  devoid of cartilage completely.  We then cleaned the remnants of the acetabulum labrum and other debris from the acetabulum and placed a bent Hohmann over the medial acetabular rim and then began reaming under direct visualization from  a size 43 reamer and going up to a size 49 with all reamers under direct visualization, the last reamer under direct fluoroscopy, so we could  obtain our depth of reaming our inclination and anteversion.  I then placed the real DePuy Sector Gription  acetabular component size 50 and a single screw and a 32+0 neutral polyethylene liner for that size acetabular component.  Attention was then turned to the femur.  With the leg externally rotated to 120 degrees, extended and adducted we were able to  place a Mueller retractor medially and a Hohmann retractor above the greater trochanter, released lateral joint capsule and used a box-cutting osteotome to enter femoral canal and a rongeur to lateralize then began broaching from a size 8 broach using  Corail broaching system up to a size 12.  With the 12 in place, we tried a varus offset femoral neck and a 32+1 hip ball reduced this in the acetabulum and we were pleased with leg length, offset, range of motion and stability.  We then dislocated the  hip and removed the trial components.  We placed the real Corail femoral component with varus offset size 12 and the real 36+1 ceramic hip ball reducing the acetabulum.  After reducing it though I felt like there was actually too much shuck in play in  the hip.  I could not dislocate it, but it just did not feel stable to me so we dislocated the hip and removed at 32+1 ceramic hip ball.  I repositioned the stem and went with a 32+5 metal hip ball and reduced this in the acetabulum and definitely felt  more stable after that.  This correlated with her plain film and intraoperative fluoroscopic findings.  Of note, her leg lengths lying supine are equal but radiographically pre and postop she looks shorter on that side, which she is not clinically.  We  then irrigated the soft tissue with normal saline solution using pulsatile lavage.  Closed the joint capsule with interrupted #1 Ethibond suture, followed by running 0 Vicryl and tensor fascia, 0 Vicryl in deep tissue, 2-0 Vicryl subcutaneous tissue, 4-0  Monocryl subcuticular stitch and Steri-Strips on the skin.   An Aquacel dressing was applied.  She was taken off the Hana table, awake and extubated, and taken to recovery room in stable condition.  All final counts were correct.  There were no  complications noted.  Of note, [name], PA-C, assisted in the entire case.  Assistance was crucial for facilitating all aspects of this case.  TN/NUANCE  D:03/08/2018 T:03/08/2018 JOB:[id number]

## 2018-03-08 NOTE — Progress Notes (Signed)
Physical Therapy Evaluation Patient Details Name: Evelyn Nelson MRN: [id number] DOB: [birth year] Today's Date: 03/08/2018   History of Present Illness  Pt is a 67 y/o female s/p L THA, direct anterior approach. PMH includes R THA and arthritis.   Clinical Impression  Pt is s/p surgery above with deficits below. Pt with shakiness during ambulation, so mobility limited to within the room. Required min to min guard for mobility with RW. Educated about supine HEP. Will continue to follow acutely to maximize functional mobility independence and safety.     Follow Up Recommendations Follow surgeon's recommendation for DC plan and follow-up therapies;Supervision for mobility/OOB    Equipment Recommendations  None recommended by PT    Recommendations for Other Services       Precautions / Restrictions Precautions Precautions: None Restrictions Weight Bearing Restrictions: Yes LLE Weight Bearing: Weight bearing as tolerated      Mobility  Bed Mobility Overal bed mobility: Needs Assistance Bed Mobility: Supine to Sit     Supine to sit: Min assist     General bed mobility comments: Min A for LLE assist. Increased time required.   Transfers Overall transfer level: Needs assistance Equipment used: Rolling walker (2 wheeled) Transfers: Sit to/from Stand Sit to Stand: Min assist         General transfer comment: Min A for lift assist and steadying. Demonstrated safe hand placement.   Ambulation/Gait Ambulation/Gait assistance: Min guard Gait Distance (Feet): 15 Feet Assistive device: Rolling walker (2 wheeled) Gait Pattern/deviations: Step-to pattern;Decreased step length - right;Decreased step length - left;Decreased weight shift to left;Antalgic Gait velocity: Decreased    General Gait Details: Slow, antalgic gait. Pt with shakiness during ambulation, so distance limited to within the room. Verbal cues for sequencing using RW.   Stairs            Wheelchair  Mobility    Modified Rankin (Stroke Patients Only)       Balance Overall balance assessment: Needs assistance Sitting-balance support: No upper extremity supported;Feet supported Sitting balance-Leahy Scale: Good     Standing balance support: Bilateral upper extremity supported;During functional activity Standing balance-Leahy Scale: Poor Standing balance comment: Reliant on BUE support.                              Pertinent Vitals/Pain Pain Assessment: 0-10 Pain Score: 6  Pain Location: L hip  Pain Descriptors / Indicators: Aching;Operative site guarding Pain Intervention(s): Limited activity within patient's tolerance;Monitored during session;Repositioned    Home Living Family/patient expects to be discharged to:: Private residence Living Arrangements: Alone Available Help at Discharge: Family;Available PRN/intermittently Type of Home: House Home Access: Stairs to enter Entrance Stairs-Rails: Left Entrance Stairs-Number of Steps: 6 Home Layout: One level Home Equipment: Walker - 2 wheels      Prior Function Level of Independence: Independent               Hand Dominance        Extremity/Trunk Assessment   Upper Extremity Assessment Upper Extremity Assessment: Defer to OT evaluation    Lower Extremity Assessment Lower Extremity Assessment: LLE deficits/detail LLE Deficits / Details: Sensory in tact. Deficits consistent with post op pain and weakness. Able to perform ther ex below.     Cervical / Trunk Assessment Cervical / Trunk Assessment: Normal  Communication   Communication: No difficulties  Cognition Arousal/Alertness: Awake/alert Behavior During Therapy: WFL for tasks assessed/performed Overall Cognitive Status: Within Functional Limits  for tasks assessed                                        General Comments      Exercises Total Joint Exercises Ankle Circles/Pumps: AROM;Both;20 reps Quad Sets:  AROM;Left;10 reps Heel Slides: AROM;Left;10 reps   Assessment/Plan    PT Assessment Patient needs continued PT services  PT Problem List Decreased strength;Decreased balance;Decreased mobility;Decreased knowledge of use of DME;Decreased knowledge of precautions;Pain       PT Treatment Interventions DME instruction;Stair training;Gait training;Functional mobility training;Therapeutic activities;Balance training;Therapeutic exercise;Patient/family education    PT Goals (Current goals can be found in the Care Plan section)  Acute Rehab PT Goals Patient Stated Goal: to go home  PT Goal Formulation: With patient Time For Goal Achievement: 03/22/18 Potential to Achieve Goals: Good    Frequency 7X/week   Barriers to discharge        Co-evaluation               AM-PAC PT "6 Clicks" Daily Activity  Outcome Measure Difficulty turning over in bed (including adjusting bedclothes, sheets and blankets)?: A Little Difficulty moving from lying on back to sitting on the side of the bed? : Unable Difficulty sitting down on and standing up from a chair with arms (e.g., wheelchair, bedside commode, etc,.)?: Unable Help needed moving to and from a bed to chair (including a wheelchair)?: A Little Help needed walking in hospital room?: A Little Help needed climbing 3-5 steps with a railing? : A Lot 6 Click Score: 13    End of Session Equipment Utilized During Treatment: Gait belt Activity Tolerance: Patient tolerated treatment well Patient left: in chair;with call bell/phone within reach;with nursing/sitter in room Nurse Communication: Mobility status PT Visit Diagnosis: Unsteadiness on feet (R26.81);Muscle weakness (generalized) (M62.81);Pain Pain - Right/Left: Left Pain - part of body: Hip    Time: 6659-9357 PT Time Calculation (min) (ACUTE ONLY): 25 min   Charges:   PT Evaluation $PT Eval Low Complexity: 1 Low PT Treatments $Therapeutic Activity: 8-22 mins        Evelyn Nelson, PT, DPT  Acute Rehabilitation Services  Pager: [phone] Office: [phone]   Rudean Hitt 03/08/2018, 5:34 PM

## 2018-03-08 NOTE — Anesthesia Preprocedure Evaluation (Signed)
Anesthesia Evaluation  Patient identified by MRN, date of birth, ID band Patient awake    Reviewed: Allergy & Precautions, NPO status , Patient's Chart, lab work & pertinent test results  History of Anesthesia Complications (+) PONV  Airway Mallampati: IV  TM Distance: >3 FB Neck ROM: Full  Mouth opening: Limited Mouth Opening  Dental  (+) Caps, Dental Advisory Given   Pulmonary neg pulmonary ROS,    breath sounds clear to auscultation       Cardiovascular negative cardio ROS   Rhythm:Regular Rate:Normal     Neuro/Psych negative neurological ROS     GI/Hepatic negative GI ROS, Neg liver ROS,   Endo/Other  negative endocrine ROS  Renal/GU negative Renal ROS     Musculoskeletal  (+) Arthritis ,   Abdominal   Peds  Hematology negative hematology ROS (+)   Anesthesia Other Findings   Reproductive/Obstetrics                             Anesthesia Physical Anesthesia Plan  ASA: II  Anesthesia Plan: Spinal   Post-op Pain Management:    Induction:   PONV Risk Score and Plan: 3 and Scopolamine patch - Pre-op, Ondansetron and Dexamethasone  Airway Management Planned: Natural Airway and Nasal Cannula  Additional Equipment:   Intra-op Plan:   Post-operative Plan:   Informed Consent: I have reviewed the patients History and Physical, chart, labs and discussed the procedure including the risks, benefits and alternatives for the proposed anesthesia with the patient or authorized representative who has indicated his/her understanding and acceptance.   Dental advisory given  Plan Discussed with: CRNA and Surgeon  Anesthesia Plan Comments: (Plan routine monitors, SAB)        Anesthesia Quick Evaluation

## 2018-03-08 NOTE — Anesthesia Procedure Notes (Signed)
Procedure Name: LMA Insertion Date/Time: 03/08/2018 1:02 PM Performed by: Annye Asa, MD Pre-anesthesia Checklist: Patient identified, Emergency Drugs available, Suction available and Patient being monitored Patient Re-evaluated:Patient Re-evaluated prior to induction Oxygen Delivery Method: Circle System Utilized Preoxygenation: Pre-oxygenation with 100% oxygen Induction Type: IV induction Ventilation: Mask ventilation without difficulty LMA: LMA inserted LMA Size: 4.0 Number of attempts: 1 Placement Confirmation: positive ETCO2 Tube secured with: Tape Dental Injury: Teeth and Oropharynx as per pre-operative assessment

## 2018-03-08 NOTE — H&P (Signed)
TOTAL HIP ADMISSION H&P  Patient is admitted for left total hip arthroplasty.  Subjective:  Chief Complaint: left hip pain  HPI: Evelyn Nelson, 67 y.o. female, has a history of pain and functional disability in the left hip(s) due to arthritis and patient has failed non-surgical conservative treatments for greater than 12 weeks to include NSAID's and/or analgesics, corticosteriod injections, flexibility and strengthening excercises, use of assistive devices and activity modification.  Onset of symptoms was abrupt starting 1 years ago with rapidlly worsening course since that time.The patient noted no past surgery on the left hip(s).  Patient currently rates pain in the left hip at 10 out of 10 with activity. Patient has night pain, worsening of pain with activity and weight bearing, trendelenberg gait, pain that interfers with activities of daily living and pain with passive range of motion. Patient has evidence of subchondral cysts, subchondral sclerosis, periarticular osteophytes and joint space narrowing by imaging studies. This condition presents safety issues increasing the risk of falls.  There is no current active infection.  Patient Active Problem List   Diagnosis Date Noted  . Unilateral primary osteoarthritis, left hip 01/10/2018  . Pain in left hip 08/18/2017  . Left-sided low back pain with left-sided sciatica 08/18/2017  . Left wrist pain 05/18/2016  . Closed fracture of left distal radius and ulna, with routine healing, subsequent encounter 04/17/2016  . Osteoarthritis of right hip 04/09/2015  . Status post total replacement of right hip 04/09/2015  . Osteopenia 03/17/2012   Past Medical History:  Diagnosis Date  . Arthritis    OA, back (epidural injection x2)  & hip  . Complication of anesthesia   . Osteopenia 05/2016   T score -1.9 FRAX 18%/2.8%  . PONV (postoperative nausea and vomiting)     Past Surgical History:  Procedure Laterality Date  . TONSILLECTOMY    .  TOTAL HIP ARTHROPLASTY Right 04/09/2015   Procedure: RIGHT TOTAL HIP ARTHROPLASTY ANTERIOR APPROACH;  Surgeon: Mcarthur Rossetti, MD;  Location: [place];  Service: Orthopedics;  Laterality: Right;  NEEDS RNFA  . TUBAL LIGATION  1984    Current Facility-Administered Medications  Medication Dose Route Frequency Provider Last Rate Last Dose  . ceFAZolin (ANCEF) 2-4 GM/100ML-% IVPB           . ceFAZolin (ANCEF) IVPB 2g/100 mL premix  2 g Intravenous On Call to OR Pete Pelt, PA-C      . chlorhexidine (HIBICLENS) 4 % liquid 4 application  60 mL Topical Once Erskine Emery W, PA-C      . tranexamic acid (CYKLOKAPRON) 1,000 mg in sodium chloride 0.9 % 100 mL IVPB  1,000 mg Intravenous To OR Mcarthur Rossetti, MD       Facility-Administered Medications Ordered in Other Encounters  Medication Dose Route Frequency Provider Last Rate Last Dose  . scopolamine (TRANSDERM-SCOP) 1 MG/3DAYS    Anesthesia Intra-op Barrington Ellison, CRNA   1 patch at 03/08/18 1140   No Known Allergies  Social History   Tobacco Use  . Smoking status: Never Smoker  . Smokeless tobacco: Never Used  Substance Use Topics  . Alcohol use: No    Family History  Problem Relation Age of Onset  . Lung cancer Father   . Hodgkin's lymphoma Son      Review of Systems  Musculoskeletal: Positive for joint pain.  All other systems reviewed and are negative.   Objective:  Physical Exam  Constitutional: She is oriented to person, place, and  time. She appears well-developed and well-nourished.  HENT:  Head: Normocephalic and atraumatic.  Eyes: Pupils are equal, round, and reactive to light. EOM are normal.  Neck: Normal range of motion. Neck supple.  Cardiovascular: Normal rate and regular rhythm.  Respiratory: Effort normal and breath sounds normal.  GI: Soft. Bowel sounds are normal.  Musculoskeletal:       Left hip: She exhibits decreased range of motion, decreased strength, tenderness and bony tenderness.   Neurological: She is alert and oriented to person, place, and time.  Skin: Skin is warm and dry.  Psychiatric: She has a normal mood and affect.    Vital signs in last 24 hours: Temp:  [97.8 F (36.6 C)] 97.8 F (36.6 C) (09/17 1021) Pulse Rate:  [72] 72 (09/17 1021) Resp:  [18] 18 (09/17 1021) BP: (174)/(77) 174/77 (09/17 1021) SpO2:  [100 %] 100 % (09/17 1021) Weight:  [76.6 kg] 76.6 kg (09/17 1025)  Labs:   Estimated body mass index is 26.45 kg/m as calculated from the following:   Height as of this encounter: 5\' 7"  (1.702 m).   Weight as of this encounter: 76.6 kg.   Imaging Review Plain radiographs demonstrate severe degenerative joint disease of the left hip(s). The bone quality appears to be excellent for age and reported activity level.    Preoperative templating of the joint replacement has been completed, documented, and submitted to the Operating Room personnel in order to optimize intra-operative equipment management.     Assessment/Plan:  End stage arthritis, left hip(s)  The patient history, physical examination, clinical judgement of the provider and imaging studies are consistent with end stage degenerative joint disease of the left hip(s) and total hip arthroplasty is deemed medically necessary. The treatment options including medical management, injection therapy, arthroscopy and arthroplasty were discussed at length. The risks and benefits of total hip arthroplasty were presented and reviewed. The risks due to aseptic loosening, infection, stiffness, dislocation/subluxation,  thromboembolic complications and other imponderables were discussed.  The patient acknowledged the explanation, agreed to proceed with the plan and consent was signed. Patient is being admitted for inpatient treatment for surgery, pain control, PT, OT, prophylactic antibiotics, VTE prophylaxis, progressive ambulation and ADL's and discharge planning.The patient is planning to be  discharged home with home health services

## 2018-03-08 NOTE — Brief Op Note (Signed)
03/08/2018  2:06 PM  PATIENT:  Chyrl Civatte  67 y.o. female  PRE-OPERATIVE DIAGNOSIS:  Osteoarthritis Left Hip  POST-OPERATIVE DIAGNOSIS:  Osteoarthritis Left Hip  PROCEDURE:  Procedure(s): LEFT TOTAL HIP ARTHROPLASTY ANTERIOR APPROACH (Left)  SURGEON:  Surgeon(s) and Role:    Mcarthur Rossetti, MD - Primary  PHYSICIAN ASSISTANT: [name], PA-C assisted  ANESTHESIA:   spinal and general  EBL:  200 mL   COUNTS:  YES  DICTATION: .Other Dictation: Dictation Number [id number]  PLAN OF CARE: Admit to inpatient   PATIENT DISPOSITION:  PACU - hemodynamically stable.   Delay start of Pharmacological VTE agent (>24hrs) due to surgical blood loss or risk of bleeding: no

## 2018-03-08 NOTE — Anesthesia Procedure Notes (Signed)
Spinal  Patient location during procedure: OR End time: 03/08/2018 12:16 PM Staffing Anesthesiologist: Annye Asa, MD Performed: anesthesiologist  Preanesthetic Checklist Completed: patient identified, site marked, surgical consent, pre-op evaluation, timeout performed, IV checked, risks and benefits discussed and monitors and equipment checked Spinal Block Patient position: sitting Prep: site prepped and draped and DuraPrep Patient monitoring: blood pressure, continuous pulse ox, cardiac monitor and heart rate Approach: midline Location: L3-4 Injection technique: single-shot Needle Needle type: Pencan  Needle gauge: 24 G Needle length: 9 cm Additional Notes Pt identified in Operating room.  Monitors applied. Working IV access confirmed. Sterile prep, drape lumbar spine.  1% lido local L 3,4.  #24ga Pencan into clear CSF L 3,4.  13.5 mg 0.75% Bupivacaine with dextrose injected with asp CSF beginning and end of injection.  Patient asymptomatic, VSS, no heme aspirated, tolerated well.  Jenita Seashore, MD

## 2018-03-09 ENCOUNTER — Encounter (HOSPITAL_COMMUNITY): Payer: Self-pay | Admitting: General Practice

## 2018-03-09 ENCOUNTER — Other Ambulatory Visit: Payer: Self-pay

## 2018-03-09 ENCOUNTER — Telehealth (INDEPENDENT_AMBULATORY_CARE_PROVIDER_SITE_OTHER): Payer: Self-pay | Admitting: Orthopaedic Surgery

## 2018-03-09 LAB — BASIC METABOLIC PANEL
Anion gap: 11 (ref 5–15)
BUN: 6 mg/dL — ABNORMAL LOW (ref 8–23)
CO2: 26 mmol/L (ref 22–32)
Calcium: 8.7 mg/dL — ABNORMAL LOW (ref 8.9–10.3)
Chloride: 98 mmol/L (ref 98–111)
Creatinine, Ser: 0.7 mg/dL (ref 0.44–1.00)
GFR calc Af Amer: >60 mL/min (ref >60)
GFR calc non Af Amer: >60 mL/min (ref >60)
Glucose, Bld: 122 mg/dL — ABNORMAL HIGH (ref 70–99)
Potassium: 4.4 mmol/L (ref 3.5–5.1)
Sodium: 135 mmol/L (ref 135–145)

## 2018-03-09 LAB — CBC
HCT: 29.5 % — ABNORMAL LOW (ref 36.0–46.0)
Hemoglobin: 9.8 g/dL — ABNORMAL LOW (ref 12.0–15.0)
MCH: 31.1 pg (ref 26.0–34.0)
MCHC: 33.2 g/dL (ref 30.0–36.0)
MCV: 93.7 fL (ref 78.0–100.0)
Platelets: 238 10*3/uL (ref 150–400)
RBC: 3.15 MIL/uL — ABNORMAL LOW (ref 3.87–5.11)
RDW: 12.8 % (ref 11.5–15.5)
WBC: 8.9 10*3/uL (ref 4.0–10.5)

## 2018-03-09 MED ORDER — FLUCONAZOLE 150 MG PO TABS
150.0000 mg | ORAL_TABLET | Freq: Once | ORAL | Status: AC
  Administered 2018-03-09: 150 mg via ORAL
  Filled 2018-03-09: qty 1

## 2018-03-09 MED ORDER — INFLUENZA VAC SPLIT HIGH-DOSE 0.5 ML IM SUSY
0.5000 mL | PREFILLED_SYRINGE | INTRAMUSCULAR | Status: AC
  Administered 2018-03-10: 0.5 mL via INTRAMUSCULAR
  Filled 2018-03-09: qty 0.5

## 2018-03-09 NOTE — Progress Notes (Signed)
Subjective: 1 Day Post-Op Procedure(s) (LRB): LEFT TOTAL HIP ARTHROPLASTY ANTERIOR APPROACH (Left) Patient reports pain as moderate.  Acute blood loss anemia from surgery, but vitals stable and asymptomatic.  Objective: Vital signs in last 24 hours: Temp:  [97.2 F (36.2 C)-98 F (36.7 C)] 97.9 F (36.6 C) (09/18 0356) Pulse Rate:  [64-76] 64 (09/18 0356) Resp:  [12-18] 14 (09/18 0356) BP: (100-174)/(57-77) 124/64 (09/18 0356) SpO2:  [98 %-100 %] 99 % (09/18 0356) Weight:  [76.6 kg] 76.6 kg (09/17 1025)  Intake/Output from previous day: 09/17 0701 - 09/18 0700 In: 2055.8 [P.O.:50; I.V.:1955.8] Out: 625 [Urine:425; Blood:200] Intake/Output this shift: No intake/output data recorded.  Recent Labs    03/09/18 0509  HGB 9.8*   Recent Labs    03/09/18 0509  WBC 8.9  RBC 3.15*  HCT 29.5*  PLT 238   Recent Labs    03/09/18 0509  NA 135  K 4.4  CL 98  CO2 26  BUN 6*  CREATININE 0.70  GLUCOSE 122*  CALCIUM 8.7*   No results for input(s): LABPT, INR in the last 72 hours.  Sensation intact distally Intact pulses distally Dorsiflexion/Plantar flexion intact Incision: dressing C/D/I   Assessment/Plan: 1 Day Post-Op Procedure(s) (LRB): LEFT TOTAL HIP ARTHROPLASTY ANTERIOR APPROACH (Left) Up with therapy Plan for discharge tomorrow Discharge home with home health    Mcarthur Rossetti 03/09/2018, 7:46 AM

## 2018-03-09 NOTE — Telephone Encounter (Signed)
Lorriane Shire from [facility] called left voicemail message needing to verify surgery information. She advised needing to know  If patient has left hip total arthroscopy on 03/08/18 and if procedure is considered open or laparoscopy. (Inpatient) need admission and discharge date, post op visit date and estimated return to work date. She advised need this information to review for disability. The number to contact Lorriane Shire is [phone]   Case # [id number]

## 2018-03-09 NOTE — Evaluation (Signed)
Occupational Therapy Evaluation Patient Details Name: Evelyn Nelson MRN: [id number] DOB: 18-Sep-1950 Today's Date: 03/09/2018    History of Present Illness Pt is a 66 y/o female s/p L THA, direct anterior approach. PMH includes R THA and arthritis.    Clinical Impression   PTA Pt independent in ADL and mobility. Pt is currently min guard for transfers, mod A for LB ADL, and unable to complete standing grooming tasks (although can static stand for short periods of time for toilet/clothing management). Pt will benefit from skilled OT in the acute setting to focus on AE education and tub transfer - to maximize safety and independence in ADL and functional transfers.     Follow Up Recommendations  Supervision - Intermittent    Equipment Recommendations  3 in 1 bedside commode    Recommendations for Other Services       Precautions / Restrictions Precautions Precautions: None Restrictions Weight Bearing Restrictions: Yes LLE Weight Bearing: Weight bearing as tolerated      Mobility Bed Mobility               General bed mobility comments: Pt in chair upon arrival  Transfers Overall transfer level: Needs assistance Equipment used: Rolling walker (2 wheeled) Transfers: Sit to/from Stand Sit to Stand: Min guard         General transfer comment: min guard for safety, increased time and effort; good hand placement observed    Balance Overall balance assessment: Needs assistance Sitting-balance support: No upper extremity supported;Feet supported Sitting balance-Leahy Scale: Good     Standing balance support: Bilateral upper extremity supported;During functional activity Standing balance-Leahy Scale: Fair Standing balance comment: static standing for short periods without BUE support ok                           ADL either performed or assessed with clinical judgement   ADL Overall ADL's : Needs assistance/impaired Eating/Feeding: Independent    Grooming: Min guard;Standing;Wash/dry hands   Upper Body Bathing: Set up;Sitting   Lower Body Bathing: Moderate assistance;Sitting/lateral leans   Upper Body Dressing : Set up;Sitting   Lower Body Dressing: Sit to/from stand;Moderate assistance Lower Body Dressing Details (indicate cue type and reason): unable to reach feet to don/doff socks - can manage underwear in standing Toilet Transfer: Min guard;Ambulation;RW Toilet Transfer Details (indicate cue type and reason): increased time and effort with RW Toileting- Clothing Manipulation and Hygiene: Min guard;Sit to/from stand Toileting - Clothing Manipulation Details (indicate cue type and reason): to manage underwear     Functional mobility during ADLs: Min guard;Rolling walker(increased time required) General ADL Comments: Pt will benefit from continued education in AE for LB ADL and tub transfer with 3in1     Vision Baseline Vision/History: Wears glasses Wears Glasses: At all times Patient Visual Report: No change from baseline       Perception     Praxis      Pertinent Vitals/Pain Pain Assessment: Faces Faces Pain Scale: Hurts even more Pain Location: L hip  Pain Descriptors / Indicators: Operative site guarding;Sore;Aching Pain Intervention(s): Monitored during session;Repositioned;Ice applied     Hand Dominance Right   Extremity/Trunk Assessment Upper Extremity Assessment Upper Extremity Assessment: Overall WFL for tasks assessed   Lower Extremity Assessment Lower Extremity Assessment: LLE deficits/detail LLE Deficits / Details: Sensory in tact. Deficits consistent with post op pain and weakness.   Cervical / Trunk Assessment Cervical / Trunk Assessment: Normal   Communication Communication Communication:  No difficulties   Cognition Arousal/Alertness: Awake/alert Behavior During Therapy: WFL for tasks assessed/performed Overall Cognitive Status: Within Functional Limits for tasks assessed                                      General Comments       Exercises     Shoulder Instructions      Home Living Family/patient expects to be discharged to:: Private residence Living Arrangements: Alone Available Help at Discharge: Family;Available PRN/intermittently Type of Home: House Home Access: Stairs to enter [employer] of Steps: 6 Entrance Stairs-Rails: Left Home Layout: One level     Bathroom Shower/Tub: Tub/shower unit;Curtain   Bathroom Toilet: Handicapped height Bathroom Accessibility: Yes How Accessible: Accessible via walker Home Equipment: [place] - 2 wheels   Additional Comments: son lives behind her, grandaughter planning on assisting      Prior Functioning/Environment Level of Independence: Independent                 OT Problem List: Decreased range of motion;Decreased activity tolerance;Impaired balance (sitting and/or standing);Decreased knowledge of use of DME or AE;Pain      OT Treatment/Interventions: Self-care/ADL training;DME and/or AE instruction;Therapeutic activities;Patient/family education;Balance training    OT Goals(Current goals can be found in the care plan section) Acute Rehab OT Goals Patient Stated Goal: to go home  OT Goal Formulation: With patient Time For Goal Achievement: 03/23/18 Potential to Achieve Goals: Good ADL Goals Pt Will Perform Lower Body Bathing: with supervision;with adaptive equipment;sit to/from stand Pt Will Perform Lower Body Dressing: with supervision;sit to/from stand;with adaptive equipment Pt Will Transfer to Toilet: with modified independence;ambulating Pt Will Perform Toileting - Clothing Manipulation and hygiene: with modified independence;sit to/from stand Pt Will Perform Tub/Shower Transfer: with supervision;anterior/posterior transfer;3 in 1;rolling walker  OT Frequency: Min 2X/week   Barriers to D/C:            Co-evaluation              AM-PAC PT "6 Clicks" Daily  Activity     Outcome Measure Help from another person eating meals?: None Help from another person taking care of personal grooming?: A Little Help from another person toileting, which includes using toliet, bedpan, or urinal?: A Little Help from another person bathing (including washing, rinsing, drying)?: A Lot Help from another person to put on and taking off regular upper body clothing?: None Help from another person to put on and taking off regular lower body clothing?: A Lot 6 Click Score: 18   End of Session Equipment Utilized During Treatment: Gait belt;Rolling walker Nurse Communication: Mobility status  Activity Tolerance: Patient tolerated treatment well Patient left: in chair;with call bell/phone within reach  OT Visit Diagnosis: Unsteadiness on feet (R26.81);Other abnormalities of gait and mobility (R26.89);Pain Pain - Right/Left: Left Pain - part of body: Hip                Time: 8110-3159 OT Time Calculation (min): 28 min Charges:  OT General Charges $OT Visit: 1 Visit OT Evaluation $OT Eval Moderate Complexity: 1 Mod OT Treatments $Self Care/Home Management : 8-22 mins  Hulda Humphrey OTR/L Acute Rehabilitation Services Pager: [phone] Office: [phone]  Evelyn Nelson 03/09/2018, 4:39 PM

## 2018-03-09 NOTE — Progress Notes (Signed)
Physical Therapy Treatment Patient Details Name: Evelyn Nelson MRN: [id number] DOB: 1951-03-01 Today's Date: 03/09/2018    History of Present Illness Pt is a 67 y/o female s/p L THA, direct anterior approach. PMH includes R THA and arthritis.     PT Comments    Pt progressing towards goals, performing transfers and ambulation with min G assist with RW for safety. Pt with decreased shakiness at today's session, but demonstrating a stiff gait pattern. Educated on seated HEP in addition to supine HEP for LLE. Pt will benefit from another PT session this afternoon in order to maximize independence with post-op mobility. Will continue to follow and progress acutely as able per POC.   Follow Up Recommendations  Follow surgeon's recommendation for DC plan and follow-up therapies;Supervision for mobility/OOB     Equipment Recommendations  None recommended by PT    Recommendations for Other Services       Precautions / Restrictions Precautions Precautions: None Restrictions Weight Bearing Restrictions: Yes LLE Weight Bearing: Weight bearing as tolerated    Mobility  Bed Mobility               General bed mobility comments: Pt in chair upon arrival  Transfers Overall transfer level: Needs assistance Equipment used: Rolling walker (2 wheeled) Transfers: Sit to/from Stand Sit to Stand: Min guard;Supervision         General transfer comment: Supervision initially at beginning of session regressing to min G at end of session sescondary to pt needing to lay down during session due to reports of feeling lightheaded and clammy. Demonstrating safe hand placement without cues at time of session.   Ambulation/Gait Ambulation/Gait assistance: Min guard Gait Distance (Feet): 125 Feet Assistive device: Rolling walker (2 wheeled) Gait Pattern/deviations: Decreased step length - right;Decreased step length - left;Decreased weight shift to left;Antalgic;Step-through pattern;Decreased  dorsiflexion - right;Decreased dorsiflexion - left Gait velocity: Decreased  Gait velocity interpretation: <1.31 ft/sec, indicative of household ambulator General Gait Details: Slow and guarded throughout gait. Cues required throuhgout for upright posture and eyes forward with good carryover. Pt required cues for hand placement on RW. Pt with decreased shakiness at time of session. Pt demonstrating decreased ROM in BIL LE throughout gait cycle, ambulating with increased stiffness. No reports of increase in pain with ambulation but reported fatigue and feeling "clammy" at end of session requesting a cold wash cloth for her neck, which she reports happens periodically at baseline. O2 at 98% with HR at 88 bpm and pt reporting "clammy" feeling was gone.    Stairs Stairs: Yes Stairs assistance: Min assist Stair Management: One rail Left;Step to pattern;Forwards Number of Stairs: 5 General stair comments: Pt ascended stairs forwards with non-operative LE leading and descended forwards with operative LE leading, Pt utilzing hand rail on left and HHA+1 on right side. Verbally instructed pt's family members on proper guarding and HHA when assisting. Verbally discussed ascending/descending stairs sideways. Pt demonstraing proper LE sequencing of stairs without cues. Cues for overall safety required. After performing 5 steps pt requesting to lay down on mat in gym reporting she felt "clammy", "Weak", and "lightheaded". After assting pt in supine and elevated bil LEs above heart, applied cold moist wash cloth on head, and took pt's BP. VSS at 110/55 mmHg and HR at 72bpm. After approxametly 5-10 minutes pt reported feeling better.   Wheelchair Mobility    Modified Rankin (Stroke Patients Only)       Balance Overall balance assessment: Needs assistance Sitting-balance support: No upper  extremity supported;Feet supported Sitting balance-Leahy Scale: Good     Standing balance support: Bilateral upper  extremity supported;During functional activity Standing balance-Leahy Scale: Poor Standing balance comment: Reliant on BUE support.                             Cognition Arousal/Alertness: Awake/alert Behavior During Therapy: WFL for tasks assessed/performed Overall Cognitive Status: Within Functional Limits for tasks assessed                                        Exercises      General Comments        Pertinent Vitals/Pain Pain Assessment: Faces Faces Pain Scale: Hurts little more Pain Location: L hip  Pain Descriptors / Indicators: Operative site guarding;Sore;Aching Pain Intervention(s): Limited activity within patient's tolerance;Monitored during session;Repositioned;Patient requesting pain meds-RN notified    Home Living Family/patient expects to be discharged to:: Private residence Living Arrangements: Alone                  Prior Function            PT Goals (current goals can now be found in the care plan section) Acute Rehab PT Goals Patient Stated Goal: to go home  PT Goal Formulation: With patient Time For Goal Achievement: 03/22/18 Potential to Achieve Goals: Good Progress towards PT goals: Progressing toward goals    Frequency    7X/week      PT Plan Current plan remains appropriate    Co-evaluation              AM-PAC PT "6 Clicks" Daily Activity  Outcome Measure  Difficulty turning over in bed (including adjusting bedclothes, sheets and blankets)?: A Little Difficulty moving from lying on back to sitting on the side of the bed? : A Little Difficulty sitting down on and standing up from a chair with arms (e.g., wheelchair, bedside commode, etc,.)?: Unable Help needed moving to and from a bed to chair (including a wheelchair)?: A Little Help needed walking in hospital room?: A Little Help needed climbing 3-5 steps with a railing? : A Little 6 Click Score: 16    End of Session Equipment Utilized  During Treatment: Gait belt Activity Tolerance: Other (comment)(Pt limited by reports of being lightheaded.) Patient left: in chair;with call bell/phone within reach;with family/visitor present Nurse Communication: Mobility status;Patient requests pain meds PT Visit Diagnosis: Unsteadiness on feet (R26.81);Muscle weakness (generalized) (M62.81);Pain;Difficulty in walking, not elsewhere classified (R26.2) Pain - Right/Left: Left Pain - part of body: Hip     Time: 8177-1165 PT Time Calculation (min) (ACUTE ONLY): 44 min  Charges:    Gait training= 8-18min   Therapeutic exercise= 8-14min                    Einar Crow, SPT  Student Physical Therapist Acute Rehab [phone]    Einar Crow 03/09/2018, 2:47 PM

## 2018-03-09 NOTE — Progress Notes (Signed)
Physical Therapy Treatment Patient Details Name: Evelyn Nelson MRN: [id number] DOB: 1950-09-15 Today's Date: 03/09/2018    History of Present Illness Pt is a 67 y/o female s/p L THA, direct anterior approach. PMH includes R THA and arthritis.     PT Comments    Pt making steady progress towards goals. Pt negotiated 5 steps with min A at time of session for safety. VSS s/p stairs x1 (BP=110/3mHg, HR=72 bpm) as pt requested to lay supine on mat table due to onset of sudden symptoms (see stairs comments for details). Pt returned to room in chair with all needs met and reported improvements in symptoms with O2 at 98% and HR at 87 bpm. Pt would benefit from at least 1 more PT session prior to d/c. Will continue to follow acutely and progress as able per POC.     Follow Up Recommendations  Follow surgeon's recommendation for DC plan and follow-up therapies;Supervision for mobility/OOB     Equipment Recommendations  None recommended by PT    Recommendations for Other Services       Precautions / Restrictions Precautions Precautions: None Restrictions Weight Bearing Restrictions: Yes LLE Weight Bearing: Weight bearing as tolerated    Mobility  Bed Mobility               General bed mobility comments: Pt in chair upon arrival  Transfers Overall transfer level: Needs assistance Equipment used: Rolling walker (2 wheeled) Transfers: Sit to/from Stand Sit to Stand: Min guard;Supervision         General transfer comment: Supervision initially at beginning of session regressing to min G at end of session sescondary to pt needing to lay down during session due to reports of feeling lightheaded and clammy. Demonstrating safe hand placement without cues at time of session.   Ambulation/Gait Ambulation/Gait assistance: Min guard Gait Distance (Feet): 125 Feet Assistive device: Rolling walker (2 wheeled) Gait Pattern/deviations: Decreased step length - right;Decreased step  length - left;Decreased weight shift to left;Antalgic;Step-through pattern;Decreased dorsiflexion - right;Decreased dorsiflexion - left Gait velocity: Decreased  Gait velocity interpretation: <1.31 ft/sec, indicative of household ambulator General Gait Details: Pt requiring cues for heel strike to toe off during ambulation in order to improve step length and encourage a more fluid movement. Pt required cues for eyes forward at beginning of session with good carry over. Pt ambulated to chair after requesting to lay supine on mat table after stairs secondary to feeling bad. Pt given ride back to room in chair while monitoring symptoms.    Stairs Stairs: Yes Stairs assistance: Min assist Stair Management: One rail Left;Step to pattern;Forwards Number of Stairs: 5 General stair comments: Pt ascended stairs forwards with non-operative LE leading and descended forwards with operative LE leading, Pt utilzing hand rail on left and HHA+1 on right side. Verbally instructed pt's family members on proper guarding and HHA when assisting. Verbally discussed ascending/descending stairs sideways. Pt demonstraing proper LE sequencing of stairs without cues. Cues for overall safety required. After performing 5 steps pt requesting to lay down on mat in gym reporting she felt "clammy", "Weak", and "lightheaded". After assting pt in supine and elevated bil LEs above heart, applied cold moist wash cloth on head, and took pt's BP. VSS at 110/55 mmHg and HR at 72bpm. After approxametly 5-10 minutes pt reported feeling better.   Wheelchair Mobility    Modified Rankin (Stroke Patients Only)       Balance Overall balance assessment: Needs assistance Sitting-balance support: No upper extremity  supported;Feet supported Sitting balance-Leahy Scale: Good     Standing balance support: Bilateral upper extremity supported;During functional activity Standing balance-Leahy Scale: Poor Standing balance comment: Reliant on  BUE support.                             Cognition Arousal/Alertness: Awake/alert Behavior During Therapy: WFL for tasks assessed/performed Overall Cognitive Status: Within Functional Limits for tasks assessed                                        Exercises      General Comments        Pertinent Vitals/Pain Pain Assessment: Faces Faces Pain Scale: Hurts little more Pain Location: L hip  Pain Descriptors / Indicators: Operative site guarding;Sore;Aching Pain Intervention(s): Limited activity within patient's tolerance;Monitored during session;Repositioned;Patient requesting pain meds-RN notified    Home Living Family/patient expects to be discharged to:: Private residence Living Arrangements: Alone                  Prior Function            PT Goals (current goals can now be found in the care plan section) Acute Rehab PT Goals Patient Stated Goal: to go home  PT Goal Formulation: With patient Time For Goal Achievement: 03/22/18 Potential to Achieve Goals: Good Progress towards PT goals: Progressing toward goals    Frequency    7X/week      PT Plan Current plan remains appropriate    Co-evaluation              AM-PAC PT "6 Clicks" Daily Activity  Outcome Measure  Difficulty turning over in bed (including adjusting bedclothes, sheets and blankets)?: A Little Difficulty moving from lying on back to sitting on the side of the bed? : A Little Difficulty sitting down on and standing up from a chair with arms (e.g., wheelchair, bedside commode, etc,.)?: Unable Help needed moving to and from a bed to chair (including a wheelchair)?: A Little Help needed walking in hospital room?: A Little Help needed climbing 3-5 steps with a railing? : A Little 6 Click Score: 16    End of Session Equipment Utilized During Treatment: Gait belt Activity Tolerance: Other (comment)(Pt limited by reports of being lightheaded.) Patient left:  in chair;with call bell/phone within reach;with family/visitor present Nurse Communication: Mobility status;Patient requests pain meds PT Visit Diagnosis: Unsteadiness on feet (R26.81);Muscle weakness (generalized) (M62.81);Pain;Difficulty in walking, not elsewhere classified (R26.2) Pain - Right/Left: Left Pain - part of body: Hip     Time: 8416-6063 PT Time Calculation (min) (ACUTE ONLY): 44 min  Charges:  $Gait Training: 38-52 mins                     Einar Crow, [place]  Student Physical Therapist Acute Rehab [phone]    Einar Crow 03/09/2018, 2:54 PM

## 2018-03-09 NOTE — Discharge Instructions (Signed)

## 2018-03-10 MED ORDER — OXYCODONE HCL 5 MG PO TABS: 5.0000 mg | ORAL_TABLET | ORAL | 0 refills | Status: DC | PRN

## 2018-03-10 MED ORDER — ASPIRIN 81 MG PO CHEW: 81.0000 mg | CHEWABLE_TABLET | Freq: Two times a day (BID) | ORAL | 0 refills | Status: DC

## 2018-03-10 MED ORDER — METHOCARBAMOL 500 MG PO TABS: 500.0000 mg | ORAL_TABLET | Freq: Four times a day (QID) | ORAL | 1 refills | Status: DC | PRN

## 2018-03-10 NOTE — Progress Notes (Signed)
Discharge instructions completed with pt. Pt verbalized understanding of information.  Pt denies chest pain, shortness of breath, dizziness, lightheadedness, and n/v.  Pt discharged home.

## 2018-03-10 NOTE — Discharge Summary (Signed)
Patient ID: Evelyn Nelson MRN: [id number] DOB/AGE: 69-Jan-1952 40 y.o.  Admit date: 03/08/2018 Discharge date: 03/10/2018  Admission Diagnoses:  Principal Problem:   Unilateral primary osteoarthritis, left hip Active Problems:   Status post total replacement of left hip   Discharge Diagnoses:  Same  Past Medical History:  Diagnosis Date  . Arthritis    OA, back (epidural injection x2)  & hip  . Complication of anesthesia   . Osteopenia 05/2016   T score -1.9 FRAX 18%/2.8%  . PONV (postoperative nausea and vomiting)     Surgeries: Procedure(s): LEFT TOTAL HIP ARTHROPLASTY ANTERIOR APPROACH on 03/08/2018   Consultants:   Discharged Condition: Improved  Hospital Course: Evelyn Nelson is an 67 y.o. female who was admitted 03/08/2018 for operative treatment ofUnilateral primary osteoarthritis, left hip. Patient has severe unremitting pain that affects sleep, daily activities, and work/hobbies. After pre-op clearance the patient was taken to the operating room on 03/08/2018 and underwent  Procedure(s): LEFT TOTAL HIP ARTHROPLASTY ANTERIOR APPROACH.    Patient was given perioperative antibiotics:  Anti-infectives (From admission, onward)   Start     Dose/Rate Route Frequency Ordered Stop   03/09/18 1300  fluconazole (DIFLUCAN) tablet 150 mg     150 mg Oral  Once 03/09/18 1246 03/09/18 1322   03/08/18 1815  ceFAZolin (ANCEF) IVPB 1 g/50 mL premix     1 g 100 mL/hr over 30 Minutes Intravenous Every 6 hours 03/08/18 1520 03/09/18 0817   03/08/18 1015  ceFAZolin (ANCEF) IVPB 2g/100 mL premix     2 g 200 mL/hr over 30 Minutes Intravenous On call to O.R. 03/08/18 1013 03/08/18 1220   03/08/18 1003  ceFAZolin (ANCEF) 2-4 GM/100ML-% IVPB    Note to Pharmacy:  Jill Side   : cabinet override      03/08/18 1003 03/08/18 1220       Patient was given sequential compression devices, early ambulation, and chemoprophylaxis to prevent DVT.  Patient benefited maximally from hospital stay  and there were no complications.    Recent vital signs:  Patient Vitals for the past 24 hrs:  BP Temp Temp src Pulse Resp SpO2  03/10/18 0643 129/62 99.1 F (37.3 C) Oral 88 - 100 %  03/10/18 0613 (!) 103/57 99.1 F (37.3 C) Oral 77 17 99 %  03/09/18 2026 122/65 99.5 F (37.5 C) Oral 94 17 100 %  03/09/18 1405 108/81 99 F (37.2 C) Oral 77 16 100 %  03/09/18 0900 (!) 110/55 - - - - -     Recent laboratory studies:  Recent Labs    03/09/18 0509  WBC 8.9  HGB 9.8*  HCT 29.5*  PLT 238  NA 135  K 4.4  CL 98  CO2 26  BUN 6*  CREATININE 0.70  GLUCOSE 122*  CALCIUM 8.7*     Discharge Medications:   Allergies as of 03/10/2018   No Known Allergies     Medication List    STOP taking these medications   aspirin EC 81 MG tablet Replaced by:  aspirin 81 MG chewable tablet   ibuprofen 800 MG tablet Commonly known as:  ADVIL,MOTRIN     TAKE these medications   aspirin 81 MG chewable tablet Chew 1 tablet (81 mg total) by mouth 2 (two) times daily. Replaces:  aspirin EC 81 MG tablet   cholecalciferol 1000 units tablet Commonly known as:  VITAMIN D Take 1,000 Units by mouth daily.   Fish Oil 1200 MG Caps Take 1,200  mg by mouth daily. With omega-3 360 mg   GLUCOSAMINE HCL-MSM PO Take 2 tablets by mouth daily.   MAGNESIUM PO Take 1 tablet by mouth daily.   methocarbamol 500 MG tablet Commonly known as:  ROBAXIN Take 1 tablet (500 mg total) by mouth every 6 (six) hours as needed for muscle spasms.   multivitamin with minerals Tabs tablet Take 1 tablet by mouth daily. Centrum Multivitamin for Women   oxyCODONE 5 MG immediate release tablet Commonly known as:  Oxy IR/ROXICODONE Take 1-2 tablets (5-10 mg total) by mouth every 3 (three) hours as needed for moderate pain (pain score 4-6).   TYLENOL 8 HOUR ARTHRITIS PAIN 650 MG CR tablet Generic drug:  acetaminophen Take 1,300 mg by mouth every 8 (eight) hours as needed for pain.            Durable Medical  Equipment  (From admission, onward)         Start     Ordered   03/08/18 1520  DME 3 n 1  Once     03/08/18 1520   03/08/18 1520  DME Walker rolling  Once    Question:  Patient needs a walker to treat with the following condition  Answer:  Status post total replacement of left hip   03/08/18 1520          Diagnostic Studies: Dg Pelvis Portable  Result Date: 03/08/2018 CLINICAL DATA:  Post total left hip replacement EXAM: PORTABLE PELVIS 1-2 VIEWS COMPARISON:  Pelvis film of 07/21/2017 FINDINGS: The acetabular and femoral components of the left total hip replacement are in good position. No complicating features are seen. Components of the right total hip replacement remain in normal position. The pelvic rami are intact. The SI joints appear corticated. IMPRESSION: New left total hip replacement components in good position. No complicating features. No abnormality of the prior right total hip replacement. Electronically Signed   By: Ivar Drape M.D.   On: 03/08/2018 14:54   Dg C-arm 1-60 Min  Result Date: 03/08/2018 CLINICAL DATA:  Total left hip replacement. EXAM: DG C-ARM 61-120 MIN; OPERATIVE LEFT HIP WITH PELVIS COMPARISON:  MRI 01/02/2018. FINDINGS: Total left hip replacement with anatomic alignment. Hardware intact. Four images obtained. 0 minutes 32 seconds fluoroscopy time utilized. IMPRESSION: Total left hip replacement with anatomic alignment. Electronically Signed   By: Marcello Moores  Register   On: 03/08/2018 14:13   Dg Hip Operative Unilat W Or W/o Pelvis Left  Result Date: 03/08/2018 CLINICAL DATA:  Total left hip replacement. EXAM: DG C-ARM 61-120 MIN; OPERATIVE LEFT HIP WITH PELVIS COMPARISON:  MRI 01/02/2018. FINDINGS: Total left hip replacement with anatomic alignment. Hardware intact. Four images obtained. 0 minutes 32 seconds fluoroscopy time utilized. IMPRESSION: Total left hip replacement with anatomic alignment. Electronically Signed   By: Marcello Moores  Register   On: 03/08/2018  14:13    Disposition: Discharge disposition: 01-Home or [place]    Mcarthur Rossetti, MD Follow up in 2 week(s).   Specialty:  Orthopedic Surgery Contact information: [place] [address] [phone]            Signed: Erskine Emery 03/10/2018, 8:39 AM

## 2018-03-10 NOTE — Progress Notes (Signed)
Physical Therapy Treatment Patient Details Name: Evelyn Nelson MRN: [id number] DOB: 08-07-1950 Today's Date: 03/10/2018    History of Present Illness Pt is a 67 y/o female s/p L THA, direct anterior approach. PMH includes R THA and arthritis.     PT Comments    Pt making steady progress towards goals, ambulating with gross min guard to supervision with RW for safety. Pt educated on pacing and seated/standing rest breaks as needed throughout session. Pt did not report any  symptoms s/p stairs at today's session. Verbally discussed all exercises in pt's handout, while reviewing hip abduction with use of towel as pt reported increased difficulty moving LLE actively. Do not believe pt requires a second PT session today as all questions answered in AM session. Recommending HHPT at d/c to maximize post-op mobility and strength. Will continue to follow acutely if pt is not discharged today.    Follow Up Recommendations  Follow surgeon's recommendation for DC plan and follow-up therapies;Supervision for mobility/OOB     Equipment Recommendations  None recommended by PT    Recommendations for Other Services       Precautions / Restrictions Precautions Precautions: None Restrictions Weight Bearing Restrictions: Yes LLE Weight Bearing: Weight bearing as tolerated    Mobility  Bed Mobility               General bed mobility comments: Pt in chair upon arrival  Transfers Overall transfer level: Needs assistance Equipment used: Rolling walker (2 wheeled) Transfers: Sit to/from Stand Sit to Stand: Supervision         General transfer comment: Pt supervision for safety at time of session. Demonstrating safe hand placement without cues. VCs required for pt to keep RW in close proximity prior to descent.  Ambulation/Gait Ambulation/Gait assistance: Min guard;Supervision Gait Distance (Feet): 300 Feet Assistive device: Rolling walker (2 wheeled) Gait Pattern/deviations: Decreased  weight shift to left;Antalgic;Step-through pattern;Decreased dorsiflexion - right;Decreased dorsiflexion - left;Decreased step length - left;Decreased step length - right Gait velocity: Decreased  Gait velocity interpretation: <1.31 ft/sec, indicative of household ambulator General Gait Details: Pt demonstrating improved fluency in gait pattern at time of session, with pt reporting she was working on heel strike without need for cues. Pt requires cues for upright posture throughout. Pt requiring standing rest x2 with cues for pacing. Pt denies worsening symptoms s/p ambulation.     Stairs Stairs: Yes Stairs assistance: Min guard Stair Management: One rail Left;Step to pattern;Sideways Number of Stairs: 5 General stair comments: Pt ascended stairs forwards with RLE leading and and bil UE support on L rail. Pt required cues for foot placement on step. Verbally discussed guarding techniques for family members.   Wheelchair Mobility    Modified Rankin (Stroke Patients Only)       Balance Overall balance assessment: Needs assistance Sitting-balance support: No upper extremity supported;Feet supported Sitting balance-Leahy Scale: Good     Standing balance support: Bilateral upper extremity supported;During functional activity Standing balance-Leahy Scale: Fair Standing balance comment: Pt able to wash hands at sink without use of UE support for short time. BIL UE support required for dynamic standing activities.                             Cognition Arousal/Alertness: Awake/alert Behavior During Therapy: WFL for tasks assessed/performed Overall Cognitive Status: Within Functional Limits for tasks assessed  Exercises      General Comments        Pertinent Vitals/Pain Pain Assessment: Faces Faces Pain Scale: Hurts little more Pain Location: L hip  Pain Descriptors / Indicators: Operative site  guarding;Sore Pain Intervention(s): Monitored during session;Repositioned;Limited activity within patient's tolerance    Home Living                      Prior Function            PT Goals (current goals can now be found in the care plan section) Acute Rehab PT Goals Patient Stated Goal: to go home  PT Goal Formulation: With patient Time For Goal Achievement: 03/22/18 Potential to Achieve Goals: Good Progress towards PT goals: Progressing toward goals    Frequency    7X/week      PT Plan Current plan remains appropriate    Co-evaluation              AM-PAC PT "6 Clicks" Daily Activity  Outcome Measure  Difficulty turning over in bed (including adjusting bedclothes, sheets and blankets)?: A Little Difficulty moving from lying on back to sitting on the side of the bed? : A Little Difficulty sitting down on and standing up from a chair with arms (e.g., wheelchair, bedside commode, etc,.)?: Unable Help needed moving to and from a bed to chair (including a wheelchair)?: A Little Help needed walking in hospital room?: A Little Help needed climbing 3-5 steps with a railing? : A Little 6 Click Score: 16    End of Session Equipment Utilized During Treatment: Gait belt Activity Tolerance: Patient tolerated treatment well Patient left: in chair;with call bell/phone within reach Nurse Communication: Mobility status PT Visit Diagnosis: Unsteadiness on feet (R26.81);Muscle weakness (generalized) (M62.81);Pain;Difficulty in walking, not elsewhere classified (R26.2) Pain - Right/Left: Left Pain - part of body: Hip     Time: 0820-0900 PT Time Calculation (min) (ACUTE ONLY): 40 min  Charges:  $Gait Training: 38-52 mins                     Einar Crow, [place]  Student Physical Therapist Acute Rehab [phone]    Einar Crow 03/10/2018, 1:38 PM

## 2018-03-10 NOTE — Progress Notes (Signed)
Subjective: 2 Days Post-Op Procedure(s) (LRB): LEFT TOTAL HIP ARTHROPLASTY ANTERIOR APPROACH (Left) Patient reports pain as mild.  Up this morning in bathroom. About to start working with PT.   Objective: Vital signs in last 24 hours: Temp:  [98.7 F (37.1 C)-99.5 F (37.5 C)] 99.1 F (37.3 C) (09/19 0643) Pulse Rate:  [77-94] 88 (09/19 0643) Resp:  [16-17] 17 (09/19 0613) BP: (80-129)/(55-81) 129/62 (09/19 0643) SpO2:  [99 %-100 %] 100 % (09/19 0643)  Intake/Output from previous day: 09/18 0701 - 09/19 0700 In: 840 [P.O.:840] Out: -  Intake/Output this shift: No intake/output data recorded.  Recent Labs    03/09/18 0509  HGB 9.8*   Recent Labs    03/09/18 0509  WBC 8.9  RBC 3.15*  HCT 29.5*  PLT 238   Recent Labs    03/09/18 0509  NA 135  K 4.4  CL 98  CO2 26  BUN 6*  CREATININE 0.70  GLUCOSE 122*  CALCIUM 8.7*   No results for input(s): LABPT, INR in the last 72 hours.  Neurovascular intact Dorsiflexion/Plantar flexion intact Incision: dressing C/D/I Compartment soft    Assessment/Plan: 2 Days Post-Op Procedure(s) (LRB): LEFT TOTAL HIP ARTHROPLASTY ANTERIOR APPROACH (Left) Up with therapy Discharge home with home health    Evelyn Nelson 03/10/2018, 8:32 AM

## 2018-03-10 NOTE — Care Management Note (Addendum)
Case Management Note  Patient Details  Name: Evelyn Nelson MRN: [id number] Date of Birth: 13-Jan-1951  Subjective/Objective:   Left THA                 Action/Plan: NCM spoke to pt and she has good family support to assist at home. Contacted [facility] for 3n1 bedside commode for home. Pt has RW. Offered choice for HH.(preoperatively arranged with [facility]) Pt agreeable to [facility] for [facility] PT.   Cancelled 3n1, dtr states she has one at home that belongs to pt's mother.    Expected Discharge Date:  03/10/18               Expected Discharge Plan:  [place]  In-House Referral:  NA  Discharge planning Services  CM Consult  Post Acute Care Choice:  Home Health Choice offered to:  Patient  DME Arranged:  3-N-1 DME Agency:  [place]:  PT [place] Agency:  [facility]  Status of Service:  Completed, signed off  If discussed at [employer] of Stay Meetings, dates discussed:    Additional Comments:  Erenest Rasher, RN 03/10/2018, 10:00 AM

## 2018-03-10 NOTE — Progress Notes (Signed)
Occupational Therapy Treatment Patient Details Name: Evelyn Nelson MRN: [id number] DOB: 1950/07/02 Today's Date: 03/10/2018    History of present illness Pt is a 67 y/o female s/p L THA, direct anterior approach. PMH includes R THA and arthritis.    OT comments  Pt progressing towards OT goals this session. Focus was on tub transfer (handout provided) min assist and AE education - focus on grabber/reacher and long handle sponge. Pt and daughter present throughout and verbalized/demonstrated understanding. Thank you for the opportunity to serve this patient.    Follow Up Recommendations  Supervision - Intermittent    Equipment Recommendations  3 in 1 bedside commode    Recommendations for Other Services      Precautions / Restrictions Precautions Precautions: None Restrictions Weight Bearing Restrictions: Yes LLE Weight Bearing: Weight bearing as tolerated       Mobility Bed Mobility               General bed mobility comments: Pt in chair upon arrival  Transfers Overall transfer level: Needs assistance Equipment used: Rolling walker (2 wheeled) Transfers: Sit to/from Stand Sit to Stand: Supervision         General transfer comment: Pt supervision for safety at time of session. Demonstrating safe hand placement without cues. VCs required for pt to keep RW in close proximity prior to descent.    Balance Overall balance assessment: Needs assistance Sitting-balance support: No upper extremity supported;Feet supported Sitting balance-Leahy Scale: Good     Standing balance support: Bilateral upper extremity supported;During functional activity Standing balance-Leahy Scale: Fair Standing balance comment: great static standing balance for grooming/dressing                           ADL either performed or assessed with clinical judgement   ADL Overall ADL's : Needs assistance/impaired         Upper Body Bathing: Set up;Sitting Upper Body  Bathing Details (indicate cue type and reason): educated in long handle sponge Lower Body Bathing: Supervison/ safety;With adaptive equipment;Sitting/lateral leans Lower Body Bathing Details (indicate cue type and reason): educated in long handle sponge     Lower Body Dressing: Min guard;With adaptive equipment;Sit to/from stand Lower Body Dressing Details (indicate cue type and reason): educated in Social research officer, government: Supervision/safety;Ambulation;Comfort height toilet;RW       Tub/ Shower Transfer: Minimal assistance;With caregiver independent assisting;Anterior/posterior;3 in Mudlogger Details (indicate cue type and reason): provided handout and demo Functional mobility during ADLs: Supervision/safety;Rolling walker General ADL Comments: focus was AE education and tub transfer education     Vision       Perception     Praxis      Cognition Arousal/Alertness: Awake/alert Behavior During Therapy: WFL for tasks assessed/performed Overall Cognitive Status: Within Functional Limits for tasks assessed                                          Exercises Exercises: Total Joint(Verbally discussed exercises)   Shoulder Instructions       General Comments      Pertinent Vitals/ Pain       Pain Assessment: Faces Faces Pain Scale: Hurts a little bit Pain Location: L hip  Pain Descriptors / Indicators: Operative site guarding;Sore Pain Intervention(s): Monitored during session;Repositioned;Limited activity within patient's tolerance  Home Living  Prior Functioning/Environment              Frequency  Min 2X/week        Progress Toward Goals  OT Goals(current goals can now be found in the care plan section)  Progress towards OT goals: Progressing toward goals  Acute Rehab OT Goals Patient Stated Goal: to go home  OT Goal Formulation: With  patient Time For Goal Achievement: 03/23/18 Potential to Achieve Goals: Good  Plan Discharge plan remains appropriate;Frequency remains appropriate    Co-evaluation                 AM-PAC PT "6 Clicks" Daily Activity     Outcome Measure   Help from another person eating meals?: None Help from another person taking care of personal grooming?: A Little Help from another person toileting, which includes using toliet, bedpan, or urinal?: A Little Help from another person bathing (including washing, rinsing, drying)?: A Lot Help from another person to put on and taking off regular upper body clothing?: None Help from another person to put on and taking off regular lower body clothing?: A Lot 6 Click Score: 18    End of Session Equipment Utilized During Treatment: Rolling walker  OT Visit Diagnosis: Unsteadiness on feet (R26.81);Other abnormalities of gait and mobility (R26.89);Pain Pain - Right/Left: Left Pain - part of body: Hip   Activity Tolerance Patient tolerated treatment well   Patient Left in chair;with call bell/phone within reach;with family/visitor present   Nurse Communication Mobility status        Time: 3825-0539 OT Time Calculation (min): 20 min  Charges: OT General Charges $OT Visit: 1 Visit OT Treatments $Self Care/Home Management : 8-22 mins  Hulda Humphrey OTR/L Acute Rehabilitation Services Pager: [phone] Office: [place] 03/10/2018, 11:55 AM

## 2018-03-14 ENCOUNTER — Telehealth (INDEPENDENT_AMBULATORY_CARE_PROVIDER_SITE_OTHER): Payer: Self-pay | Admitting: Orthopaedic Surgery

## 2018-03-14 NOTE — Telephone Encounter (Signed)
Verbal order left on VM  

## 2018-03-14 NOTE — Telephone Encounter (Signed)
Maria from [facility] called to request VO for the following:  HH Pt  1x a week for 1 week 3x a week for 1 week 1x a week for 1 week  CB#[phone]

## 2018-03-21 ENCOUNTER — Telehealth (INDEPENDENT_AMBULATORY_CARE_PROVIDER_SITE_OTHER): Payer: Self-pay | Admitting: Orthopaedic Surgery

## 2018-03-21 NOTE — Telephone Encounter (Signed)
Faxed to [facility] advising patient was not seen from 03/22/2017-12/131/2018 [id number]

## 2018-03-22 ENCOUNTER — Ambulatory Visit (INDEPENDENT_AMBULATORY_CARE_PROVIDER_SITE_OTHER): Payer: BLUE CROSS/BLUE SHIELD | Admitting: Orthopaedic Surgery

## 2018-03-22 ENCOUNTER — Encounter (INDEPENDENT_AMBULATORY_CARE_PROVIDER_SITE_OTHER): Payer: Self-pay | Admitting: Orthopaedic Surgery

## 2018-03-22 DIAGNOSIS — Z96642 Presence of left artificial hip joint: Secondary | ICD-10-CM

## 2018-03-22 NOTE — Progress Notes (Signed)
                                                                                                                                                                                                                                                                                                                                                                                                                                                                                                                                                                                                                                                                                                                                                                                                                                                                                                                                                                                                                                                                               +++++++++++++++++++++++++++++++++++++++++++                       HPI: Ms. Bass returns today 2 weeks status post left total hip arthroplasty.  She is overall doing well.  Is taking Tylenol for pain.  She did have a uterine urinary tract infection was placed on Bactrim.  She is having no dysuria or increased frequency at this point time.  She denies any fevers chills chest pain or shortness of breath.  She reports that she is walking away leaving her cane at times but comes in on a cane today.  Physical exam: Left hip surgical incisions healing well no signs of infection.  Skin is well approximated with a running subcu stitch.  Left calf supple nontender dorsiflexion plantarflexion ankle intact  Impression: Status post left total hip arthroplasty 2 weeks  Plan: She will work on range of motion strengthening of the left hip.  Scar tissue mobilization.  She will let the Steri-Strips that were applied today to fall off with time.  Follow-up with [place] in a month sooner if there is any questions or concerns.  We will go back on her 81 mg aspirin that she was taking prior to surgery.                     q

## 2018-03-24 ENCOUNTER — Other Ambulatory Visit (INDEPENDENT_AMBULATORY_CARE_PROVIDER_SITE_OTHER): Payer: Self-pay | Admitting: Physician Assistant

## 2018-03-24 NOTE — Telephone Encounter (Signed)
Ok to rf? 

## 2018-03-25 NOTE — Telephone Encounter (Signed)
Ok if on chronic prior to surgery otherwise does not need

## 2018-03-28 NOTE — Telephone Encounter (Signed)
Can you see if she was on prior to surgery?

## 2018-04-20 ENCOUNTER — Encounter (INDEPENDENT_AMBULATORY_CARE_PROVIDER_SITE_OTHER): Payer: Self-pay | Admitting: Orthopaedic Surgery

## 2018-04-20 ENCOUNTER — Ambulatory Visit (INDEPENDENT_AMBULATORY_CARE_PROVIDER_SITE_OTHER): Payer: BLUE CROSS/BLUE SHIELD | Admitting: Orthopaedic Surgery

## 2018-04-20 DIAGNOSIS — Z96642 Presence of left artificial hip joint: Secondary | ICD-10-CM

## 2018-04-20 NOTE — Progress Notes (Signed)
The patient is now 6 to 7 weeks status post a left total hip arthroplasty.  We actually replaced her right hip several years ago.  She is had no problems with this hip replacement terms of just weakness in her thigh and quad area.  She is doing well the first 2 weeks after surgery and is digressed some.  She was scheduled to go back to work November 11 but I do feel we need to extend this.  She is very active 67 years old but I do feel benefit from therapy.  On examination of her left hip there is some slight weakness with her hip flexion in her quads but she is walking without assistive device and her incision looks good and her hip moves well.  I would like to send her to outpatient physical therapy and I gave her prescription for this.  I do feel they can work on her quad strengthening and hip strengthening.  We will see her back in 4 weeks see how she is doing overall.  I would like a standing low AP pelvis at that visit.  I also gave her a note to extend her out of work until December 4.

## 2018-05-18 ENCOUNTER — Ambulatory Visit (INDEPENDENT_AMBULATORY_CARE_PROVIDER_SITE_OTHER): Payer: BLUE CROSS/BLUE SHIELD

## 2018-05-18 ENCOUNTER — Encounter (INDEPENDENT_AMBULATORY_CARE_PROVIDER_SITE_OTHER): Payer: Self-pay | Admitting: Orthopaedic Surgery

## 2018-05-18 ENCOUNTER — Ambulatory Visit (INDEPENDENT_AMBULATORY_CARE_PROVIDER_SITE_OTHER): Payer: BLUE CROSS/BLUE SHIELD | Admitting: Orthopaedic Surgery

## 2018-05-18 DIAGNOSIS — Z96642 Presence of left artificial hip joint: Secondary | ICD-10-CM

## 2018-05-18 NOTE — Progress Notes (Signed)
The patient is now 10 weeks status post a left total hip arthroplasty.  She is several years status post a right total hip arthroplasty.  She is walking without assistive device doing well.  She feels that she is ready to go back to work in a few weeks.  On exam I can move both hips easily without any difficulty.  She has no real issues.  She is walking without a limp.  An AP pelvis shows well-seated implants with no complicating features.  Her leg lengths are equal.  This point she will continue increase her activities and I gave her a note to return to work starting 05/30/2018.  We will see her back in 6 months with a final standing low AP pelvis.  All question concerns were answered and addressed.

## 2018-07-13 ENCOUNTER — Other Ambulatory Visit: Payer: Self-pay | Admitting: Women's Health

## 2018-07-13 DIAGNOSIS — Z1231 Encounter for screening mammogram for malignant neoplasm of breast: Secondary | ICD-10-CM

## 2018-08-22 ENCOUNTER — Ambulatory Visit
Admission: RE | Admit: 2018-08-22 | Discharge: 2018-08-22 | Disposition: A | Discharge status: Home / Self Care | Payer: BLUE CROSS/BLUE SHIELD | Source: Ambulatory Visit | Attending: Women's Health | Admitting: Women's Health

## 2018-08-22 ENCOUNTER — Ambulatory Visit (INDEPENDENT_AMBULATORY_CARE_PROVIDER_SITE_OTHER): Payer: BLUE CROSS/BLUE SHIELD | Admitting: Women's Health

## 2018-08-22 ENCOUNTER — Encounter: Payer: Self-pay | Admitting: Women's Health

## 2018-08-22 ENCOUNTER — Telehealth (INDEPENDENT_AMBULATORY_CARE_PROVIDER_SITE_OTHER): Payer: Self-pay | Admitting: Orthopaedic Surgery

## 2018-08-22 VITALS — BP 118/80 | Ht 66.0 in | Wt 172.0 lb

## 2018-08-22 DIAGNOSIS — Z1231 Encounter for screening mammogram for malignant neoplasm of breast: Secondary | ICD-10-CM

## 2018-08-22 DIAGNOSIS — M8580 Other specified disorders of bone density and structure, unspecified site: Secondary | ICD-10-CM

## 2018-08-22 DIAGNOSIS — Z01419 Encounter for gynecological examination (general) (routine) without abnormal findings: Secondary | ICD-10-CM

## 2018-08-22 DIAGNOSIS — Z13220 Encounter for screening for lipoid disorders: Secondary | ICD-10-CM

## 2018-08-22 NOTE — Telephone Encounter (Signed)
LMOM for patient letting her know handicap form is at front desk

## 2018-08-22 NOTE — Telephone Encounter (Signed)
Patient request a new handicap form, she states hers has expired. Please call if Dr Ninfa [facility] will be able to give her a new one.

## 2018-08-22 NOTE — Progress Notes (Signed)
LEVAEH VICE [birth year] [id number]    History:    Presents for annual exam.  Postmenopausal on no HRT with no bleeding.  G2P2.  Normal Pap and mammogram history.  She takes daily vitamin D and a multivitamin daily.  September 2019 left hip replacement osteoarthritis,  doing well back to work full-time. Does not engage in regular exercise but is getting regular massages with stretching exercises.  Has had annual flu, Pneumovax and Shingrix.  2017 T score -1.9 FRAX 18% / 2.8%, has had 2 hip replacements.  Past medical history, past surgical history, family history and social history were all reviewed and documented in the EPIC chart.  Right hip replacement 2016.  Works in an active job with standing and moving.  2 children both doing well.  Not sexually active in many years.  Mother in long-term care with dementia.  ROS:  A ROS was performed and pertinent positives and negatives are included.  Exam:  Vitals:   08/22/18 1007  BP: 118/80  Weight: 172 lb (78 kg)  Height: 5\' 6"  (1.676 m)   Body mass index is 27.76 kg/m.   General appearance:  Normal Thyroid:  Symmetrical, normal in size, without palpable masses or nodularity. Respiratory  Auscultation:  Clear without wheezing or rhonchi Cardiovascular  Auscultation:  Regular rate, without rubs, murmurs or gallops  Edema/varicosities:  Not grossly evident Abdominal  Soft,nontender, without masses, guarding or rebound.  Liver/spleen:  No organomegaly noted  Hernia:  None appreciated  Skin  Inspection:  Grossly normal   Breasts: Examined lying and sitting.     Right: Without masses, retractions, discharge or axillary adenopathy.     Left: Without masses, retractions, discharge or axillary adenopathy. Gentitourinary   Inguinal/mons:  Normal without inguinal adenopathy  External genitalia:  Normal  BUS/Urethra/Skene's glands:  Normal  Vagina:  +1 Cystocele present. Otherwise normal  Cervix:  Normal  Uterus:  Retroverted, normal in  size, shape and contour.  Midline and mobile  Adnexa/parametria:     Rt: Without masses or tenderness.   Lt: Without masses or tenderness.  Anus and perineum: Normal  Digital rectal exam: Normal sphincter tone without palpated masses or tenderness  Assessment/Plan:  68 y.o. DWF G2 P2 for annual exam.    Postmenopausal/no HRT/no bleeding 02/2018 left hip replacement osteoarthritis-orthopedist managing Osteopenia without elevated FRAX Vaccines current  Plan:  CBC, lipid panel,  CMP.  Normal Pap history, screening guidelines reviewed.  Continue 2000 IUs vitamin D supplements and  resistance training, increase in exercise, and yoga encouraged.  Continue SBEs, annual mammogram today reviewed importance of screening colonoscopy, has not had, [facility] information given instructed to schedule.  Leisure and self-care activities encouraged.    [place], 10:49 AM 08/22/2018

## 2018-08-22 NOTE — Patient Instructions (Signed)
shingrex  Two series of shots Vit D3 2000 iu daily Colonoscopy  Dr Carlean Purl  [facility]  [phone]   Health Maintenance After Age 71 After age 62, you are at a higher risk for certain long-term diseases and infections as well as injuries from falls. Falls are a major cause of broken bones and head injuries in people who are older than age 69. Getting regular preventive care can help to keep you healthy and well. Preventive care includes getting regular testing and making lifestyle changes as recommended by your health care provider. Talk with your health care provider about:  Which screenings and tests you should have. A screening is a test that checks for a disease when you have no symptoms.  A diet and exercise plan that is right for you. What should I know about screenings and tests to prevent falls? Screening and testing are the best ways to find a health problem early. Early diagnosis and treatment give you the best chance of managing medical conditions that are common after age 68. Certain conditions and lifestyle choices may make you more likely to have a fall. Your health care provider may recommend:  Regular vision checks. Poor vision and conditions such as cataracts can make you more likely to have a fall. If you wear glasses, make sure to get your prescription updated if your vision changes.  Medicine review. Work with your health care provider to regularly review all of the medicines you are taking, including over-the-counter medicines. Ask your health care provider about any side effects that may make you more likely to have a fall. Tell your health care provider if any medicines that you take make you feel dizzy or sleepy.  Osteoporosis screening. Osteoporosis is a condition that causes the bones to get weaker. This can make the bones weak and cause them to break more easily.  Blood pressure screening. Blood pressure changes and medicines to control blood pressure can make you feel  dizzy.  Strength and balance checks. Your health care provider may recommend certain tests to check your strength and balance while standing, walking, or changing positions.  Foot health exam. Foot pain and numbness, as well as not wearing proper footwear, can make you more likely to have a fall.  Depression screening. You may be more likely to have a fall if you have a fear of falling, feel emotionally low, or feel unable to do activities that you used to do.  Alcohol use screening. Using too much alcohol can affect your balance and may make you more likely to have a fall. What actions can I take to lower my risk of falls? General instructions  Talk with your health care provider about your risks for falling. Tell your health care provider if: ? You fall. Be sure to tell your health care provider about all falls, even ones that seem minor. ? You feel dizzy, sleepy, or off-balance.  Take over-the-counter and prescription medicines only as told by your health care provider. These include any supplements.  Eat a healthy diet and maintain a healthy weight. A healthy diet includes low-fat dairy products, low-fat (lean) meats, and fiber from whole grains, beans, and lots of fruits and vegetables. Home safety  Remove any tripping hazards, such as rugs, cords, and clutter.  Install safety equipment such as grab bars in bathrooms and safety rails on stairs.  Keep rooms and walkways well-lit. Activity   Follow a regular exercise program to stay fit. This will help you maintain  your balance. Ask your health care provider what types of exercise are appropriate for you.  If you need a cane or walker, use it as recommended by your health care provider.  Wear supportive shoes that have nonskid soles. Lifestyle  Do not drink alcohol if your health care provider tells you not to drink.  If you drink alcohol, limit how much you have: ? 0-1 drink a day for women. ? 0-2 drinks a day for  men.  Be aware of how much alcohol is in your drink. In the [place] one drink equals one typical bottle of beer (12 oz), one-half glass of wine (5 oz), or one shot of hard liquor (1 oz).  Do not use any products that contain nicotine or tobacco, such as cigarettes and e-cigarettes. If you need help quitting, ask your health care provider. Summary  Having a healthy lifestyle and getting preventive care can help to protect your health and wellness after age 68.  Screening and testing are the best way to find a health problem early and help you avoid having a fall. Early diagnosis and treatment give you the best chance for managing medical conditions that are more common for people who are older than age 37.  Falls are a major cause of broken bones and head injuries in people who are older than age 48. Take precautions to prevent a fall at home.  Work with your health care provider to learn what changes you can make to improve your health and wellness and to prevent falls. This information is not intended to replace advice given to you by your health care provider. Make sure you discuss any questions you have with your health care provider. Document Released: 04/21/2017 Document Revised: 04/21/2017 Document Reviewed: 04/21/2017 Elsevier Interactive Patient Education  2019 [employer].

## 2018-08-23 LAB — COMPREHENSIVE METABOLIC PANEL
AG Ratio: 2 (calc) (ref 1.0–2.5)
ALT: 137 U/L — ABNORMAL HIGH (ref 6–29)
AST: 123 U/L — ABNORMAL HIGH (ref 10–35)
Albumin: 4.8 g/dL (ref 3.6–5.1)
Alkaline phosphatase (APISO): 73 U/L (ref 37–153)
BUN: 11 mg/dL (ref 7–25)
CO2: 27 mmol/L (ref 20–32)
Calcium: 9.9 mg/dL (ref 8.6–10.4)
Chloride: 96 mmol/L — ABNORMAL LOW (ref 98–110)
Creat: 0.8 mg/dL (ref 0.50–0.99)
Globulin: 2.4 g/dL (calc) (ref 1.9–3.7)
Glucose, Bld: 97 mg/dL (ref 65–99)
Potassium: 5 mmol/L (ref 3.5–5.3)
Sodium: 133 mmol/L — ABNORMAL LOW (ref 135–146)
Total Bilirubin: 0.5 mg/dL (ref 0.2–1.2)
Total Protein: 7.2 g/dL (ref 6.1–8.1)

## 2018-08-23 LAB — LIPID PANEL
Cholesterol: 264 mg/dL — ABNORMAL HIGH (ref <200)
HDL: 135 mg/dL (ref >=50)
LDL Cholesterol (Calc): 112 mg/dL (calc) — ABNORMAL HIGH
Non-HDL Cholesterol (Calc): 129 mg/dL (calc) (ref <130)
Total CHOL/HDL Ratio: 2 (calc) (ref <5.0)
Triglycerides: 83 mg/dL (ref <150)

## 2018-08-23 LAB — CBC WITH DIFFERENTIAL/PLATELET
Absolute Monocytes: 647 cells/uL (ref 200–950)
Basophils Absolute: 59 cells/uL (ref 0–200)
Basophils Relative: 0.9 %
Eosinophils Absolute: 271 cells/uL (ref 15–500)
Eosinophils Relative: 4.1 %
HCT: 36.4 % (ref 35.0–45.0)
Hemoglobin: 12.5 g/dL (ref 11.7–15.5)
Lymphs Abs: 1690 cells/uL (ref 850–3900)
MCH: 31.1 pg (ref 27.0–33.0)
MCHC: 34.3 g/dL (ref 32.0–36.0)
MCV: 90.5 fL (ref 80.0–100.0)
MPV: 8 fL (ref 7.5–12.5)
Monocytes Relative: 9.8 %
Neutro Abs: 3934 cells/uL (ref 1500–7800)
Neutrophils Relative %: 59.6 %
Platelets: 333 10*3/uL (ref 140–400)
RBC: 4.02 10*6/uL (ref 3.80–5.10)
RDW: 12.7 % (ref 11.0–15.0)
Total Lymphocyte: 25.6 %
WBC: 6.6 10*3/uL (ref 3.8–10.8)

## 2018-09-12 ENCOUNTER — Telehealth: Payer: Self-pay | Admitting: *Deleted

## 2018-09-12 NOTE — Telephone Encounter (Signed)
Need to recheck liver enzymes, they have never been elevated in past.  Called pt to see follow up on Labs that was suppose to be done at [facility] was faxed 08/29/2018

## 2018-09-16 NOTE — Telephone Encounter (Signed)
Patient called back regarding the below. She has appointment scheduled on 09/20/18 for labs to be drawn,states the lab told her she had to wait 30 days before liver enzymes could be rechecked.

## 2018-11-16 ENCOUNTER — Ambulatory Visit (INDEPENDENT_AMBULATORY_CARE_PROVIDER_SITE_OTHER): Payer: BLUE CROSS/BLUE SHIELD | Admitting: Orthopaedic Surgery

## 2018-11-16 ENCOUNTER — Other Ambulatory Visit: Payer: Self-pay

## 2018-11-16 ENCOUNTER — Ambulatory Visit: Payer: Self-pay

## 2018-11-16 ENCOUNTER — Encounter: Payer: Self-pay | Admitting: Orthopaedic Surgery

## 2018-11-16 DIAGNOSIS — Z96642 Presence of left artificial hip joint: Secondary | ICD-10-CM

## 2018-11-16 DIAGNOSIS — M25562 Pain in left knee: Secondary | ICD-10-CM

## 2018-11-16 DIAGNOSIS — G8929 Other chronic pain: Secondary | ICD-10-CM

## 2018-11-16 NOTE — Progress Notes (Signed)
The patient is 8 months status post a left total hip arthroplasty.  She is several years out from her right hip being replaced.  She is very active 68 year old female.  She has been reporting left knee pain and pain in the back of her left knee.  She has been having intermittent swelling in her left foot and ankle.  She said her hips are doing well.  She still has some slight numbness around her left hip but not on the right.  In the left arm was the one we replaced more recently.  The right hip was replaced again several years ago.  On examination both hips move smoothly and fluidly.  She walks without a limp.  Her hip exams are almost normal.  There is slight numbness around her left hip incision.  Her left knee is assessed and shows no effusion.  There is fullness in the popliteal area of her knee and pain in the popliteal fossa and some posterior lateral with.  She she has pain when I rotate the lateral tibia on the lateral femoral condyle.  An x-ray of the pelvis shows both hip replacements with good bone ingrowth and are both well-seated with no complicating features or evidence of loosening.  I do feel is appropriate to obtain an MRI of her left knee given the fullness in the back of her knee and the pain she is experiencing as well as the intermittent swelling she is getting out of her foot and ankle that may be related to the knee.  Given the pain she is having in that knee and the positive McMurray sign to the lateral compartment I do feel this is medically prudent to obtain an MRI.  We will see her back once the MRI is obtained of her left knee.  All question concerns were answered and addressed.

## 2018-11-19 ENCOUNTER — Telehealth: Payer: Self-pay

## 2018-11-19 NOTE — Telephone Encounter (Signed)
vm left on unidentified vm stating to call back for testing. PEC number and hours provided, uta covid s/sx.

## 2018-11-25 ENCOUNTER — Ambulatory Visit: Payer: Self-pay

## 2018-11-25 NOTE — Telephone Encounter (Signed)
See telephone encounter.

## 2018-11-25 NOTE — Telephone Encounter (Signed)
Patient called and advised of the potential exposure to COVID-19 at the [place] on 11/16/18 at [facility] and free testing is being offered, if agreeable. She says no to the testing at this time. I advised if she changes her mind to call [phone] between the hours 0700-1900 Monday through Friday and to monitor for any symptoms from 14 days from last visit, she verbalized understanding.

## 2018-12-13 ENCOUNTER — Telehealth: Payer: Self-pay | Admitting: *Deleted

## 2018-12-13 NOTE — Telephone Encounter (Signed)
Did you call Evelyn Nelson #[phone]?  She said "kim" called her and she wants her to call her back and when she does leave a detailed message in voice mail because she doesn't get home til 5:30pm. [phone] Nothing in chart that anyone called her recently. You talked with her back in March.

## 2018-12-14 NOTE — Telephone Encounter (Signed)
Pt had labs drawn for liver enzymes  Scanned in system

## 2018-12-28 ENCOUNTER — Other Ambulatory Visit: Payer: Self-pay

## 2018-12-28 ENCOUNTER — Ambulatory Visit
Admission: RE | Admit: 2018-12-28 | Discharge: 2018-12-28 | Disposition: A | Discharge status: Home / Self Care | Payer: BC Managed Care – PPO | Source: Ambulatory Visit | Attending: Orthopaedic Surgery | Admitting: Orthopaedic Surgery

## 2018-12-28 DIAGNOSIS — M25562 Pain in left knee: Secondary | ICD-10-CM

## 2019-01-05 ENCOUNTER — Ambulatory Visit (INDEPENDENT_AMBULATORY_CARE_PROVIDER_SITE_OTHER): Payer: BC Managed Care – PPO | Admitting: Orthopaedic Surgery

## 2019-01-05 ENCOUNTER — Other Ambulatory Visit: Payer: Self-pay

## 2019-01-05 ENCOUNTER — Encounter: Payer: Self-pay | Admitting: Orthopaedic Surgery

## 2019-01-05 DIAGNOSIS — M25562 Pain in left knee: Secondary | ICD-10-CM

## 2019-01-05 DIAGNOSIS — G8929 Other chronic pain: Secondary | ICD-10-CM | POA: Diagnosis not present

## 2019-01-05 NOTE — Progress Notes (Signed)
The patient comes in today to go over an MRI of her left knee.  She is been having a lot of pain in the posterior aspect of her knee and no calf pain.  Sometimes she does get some lateral leg pain.  She does have a history of bilateral total hip arthroplasties.  She does have known stenosis in her lumbar spine.  We could palpate a Baker's cyst in her knee and there was concerned about internal derangement.  On examination her left knee she still hurts posterior lateral aspect.  She has good range of motion overall.  There is a palpable Baker's cyst.  There is no knee joint effusion.  Her range of motion is full and the knee feels ligamentously stable.  It is more of a dull aching pain.  The MRI is reviewed with her and does not show any tear in the knee in terms of meniscus.  There is only mild thinning of the articular cartilage as well.  There is a small Baker's cyst.  As of now I would not really recommend anything for the knee other than considering a steroid injection.  She says is not hurting her right now bad enough to try anything else and would let us know if it comes problematic for her.  All question concerns were answered and addressed.  Follow-up as otherwise as needed.

## 2019-02-22 ENCOUNTER — Other Ambulatory Visit: Payer: Self-pay

## 2019-02-22 ENCOUNTER — Ambulatory Visit (INDEPENDENT_AMBULATORY_CARE_PROVIDER_SITE_OTHER): Payer: BC Managed Care – PPO | Admitting: Orthopaedic Surgery

## 2019-02-22 ENCOUNTER — Encounter: Payer: Self-pay | Admitting: Orthopaedic Surgery

## 2019-02-22 DIAGNOSIS — G8929 Other chronic pain: Secondary | ICD-10-CM | POA: Diagnosis not present

## 2019-02-22 DIAGNOSIS — M4807 Spinal stenosis, lumbosacral region: Secondary | ICD-10-CM | POA: Diagnosis not present

## 2019-02-22 DIAGNOSIS — M545 Low back pain, unspecified: Secondary | ICD-10-CM

## 2019-02-22 DIAGNOSIS — M5442 Lumbago with sciatica, left side: Secondary | ICD-10-CM

## 2019-02-22 NOTE — Progress Notes (Signed)
The patient is well-known to me.  We have performed bilateral hip replacements on her anterior at times.  She is a very active 68 year old female.  She has been seen by neurosurgery in the past for her lumbar spine issues.  They cannot see her now for insurance purposes.  We did obtain an MRI in March 2019 of her lumbar spine and it did show severe stenosis that is multifactorial mainly to left-sided L4-L5 and at L5-S1.  She comes in today with low back pain just to the left side with a flareup of this pain.  She has been having ibuprofen to use which is helped and alternating ice and heat.  She would like to be considered for an epidural steroid injection.  I believe she has had these in the past.  She denies any change in bowel bladder function or weakness in her legs.  She is not walking with an assistive device.  On exam she does hurt to the lower lumbar spine just the left side in the paraspinal muscles and into the sciatic region.  It is radiating into her groin but actually radiating down the back of her leg as well.  Both hips move smoothly.  She has good strength in both her legs.  The MRI in March 2019 did show significant left-sided findings at L5-S1 and L4-L5.  I would like to send her to Dr. Ernestina Patches to consider an epidural steroid injection at L4-L5 versus L5-S1 based on her symptoms.  She deftly wants to try this as well.  All question concerns were answered and addressed.  We will work on getting that scheduled appoint with Dr. Ernestina Patches and will see [place] back in 4 weeks.

## 2019-03-29 ENCOUNTER — Encounter: Payer: Self-pay | Admitting: Gynecology

## 2019-07-17 ENCOUNTER — Other Ambulatory Visit: Payer: Self-pay | Admitting: Women's Health

## 2019-07-17 ENCOUNTER — Telehealth: Payer: Self-pay

## 2019-07-17 DIAGNOSIS — Z1231 Encounter for screening mammogram for malignant neoplasm of breast: Secondary | ICD-10-CM

## 2019-07-31 NOTE — Telephone Encounter (Signed)
error 

## 2019-08-23 ENCOUNTER — Encounter: Payer: Medicare Other | Admitting: Women's Health

## 2019-08-30 ENCOUNTER — Encounter: Payer: BLUE CROSS/BLUE SHIELD | Admitting: Women's Health

## 2019-09-04 ENCOUNTER — Ambulatory Visit (INDEPENDENT_AMBULATORY_CARE_PROVIDER_SITE_OTHER): Payer: BC Managed Care – PPO | Admitting: Women's Health

## 2019-09-04 ENCOUNTER — Other Ambulatory Visit: Payer: Self-pay

## 2019-09-04 ENCOUNTER — Ambulatory Visit
Admission: RE | Admit: 2019-09-04 | Discharge: 2019-09-04 | Disposition: A | Discharge status: Home / Self Care | Payer: BC Managed Care – PPO | Source: Ambulatory Visit | Attending: Women's Health | Admitting: Women's Health

## 2019-09-04 ENCOUNTER — Encounter: Payer: Self-pay | Admitting: Women's Health

## 2019-09-04 VITALS — BP 110/78 | Ht 66.0 in | Wt 166.0 lb

## 2019-09-04 DIAGNOSIS — Z1231 Encounter for screening mammogram for malignant neoplasm of breast: Secondary | ICD-10-CM

## 2019-09-04 DIAGNOSIS — Z13220 Encounter for screening for lipoid disorders: Secondary | ICD-10-CM

## 2019-09-04 DIAGNOSIS — Z01419 Encounter for gynecological examination (general) (routine) without abnormal findings: Secondary | ICD-10-CM

## 2019-09-04 NOTE — Patient Instructions (Addendum)
Vitamin D 2000 IUs daily Colonoscopy- GI [phone] pnuemonia vaccine   Health Maintenance After Age 71 After age 73, you are at a higher risk for certain long-term diseases and infections as well as injuries from falls. Falls are a major cause of broken bones and head injuries in people who are older than age 75. Getting regular preventive care can help to keep you healthy and well. Preventive care includes getting regular testing and making lifestyle changes as recommended by your health care provider. Talk with your health care provider about:  Which screenings and tests you should have. A screening is a test that checks for a disease when you have no symptoms.  A diet and exercise plan that is right for you. What should I know about screenings and tests to prevent falls? Screening and testing are the best ways to find a health problem early. Early diagnosis and treatment give you the best chance of managing medical conditions that are common after age 1. Certain conditions and lifestyle choices may make you more likely to have a fall. Your health care provider may recommend:  Regular vision checks. Poor vision and conditions such as cataracts can make you more likely to have a fall. If you wear glasses, make sure to get your prescription updated if your vision changes.  Medicine review. Work with your health care provider to regularly review all of the medicines you are taking, including over-the-counter medicines. Ask your health care provider about any side effects that may make you more likely to have a fall. Tell your health care provider if any medicines that you take make you feel dizzy or sleepy.  Osteoporosis screening. Osteoporosis is a condition that causes the bones to get weaker. This can make the bones weak and cause them to break more easily.  Blood pressure screening. Blood pressure changes and medicines to control blood pressure can make you feel dizzy.  Strength and  balance checks. Your health care provider may recommend certain tests to check your strength and balance while standing, walking, or changing positions.  Foot health exam. Foot pain and numbness, as well as not wearing proper footwear, can make you more likely to have a fall.  Depression screening. You may be more likely to have a fall if you have a fear of falling, feel emotionally low, or feel unable to do activities that you used to do.  Alcohol use screening. Using too much alcohol can affect your balance and may make you more likely to have a fall. What actions can I take to lower my risk of falls? General instructions  Talk with your health care provider about your risks for falling. Tell your health care provider if: ? You fall. Be sure to tell your health care provider about all falls, even ones that seem minor. ? You feel dizzy, sleepy, or off-balance.  Take over-the-counter and prescription medicines only as told by your health care provider. These include any supplements.  Eat a healthy diet and maintain a healthy weight. A healthy diet includes low-fat dairy products, low-fat (lean) meats, and fiber from whole grains, beans, and lots of fruits and vegetables. Home safety  Remove any tripping hazards, such as rugs, cords, and clutter.  Install safety equipment such as grab bars in bathrooms and safety rails on stairs.  Keep rooms and walkways well-lit. Activity   Follow a regular exercise program to stay fit. This will help you maintain your balance. Ask your health care provider what types of  exercise are appropriate for you.  If you need a cane or walker, use it as recommended by your health care provider.  Wear supportive shoes that have nonskid soles. Lifestyle  Do not drink alcohol if your health care provider tells you not to drink.  If you drink alcohol, limit how much you have: ? 0-1 drink a day for women. ? 0-2 drinks a day for men.  Be aware of how much  alcohol is in your drink. In the [place] one drink equals one typical bottle of beer (12 oz), one-half glass of wine (5 oz), or one shot of hard liquor (1 oz).  Do not use any products that contain nicotine or tobacco, such as cigarettes and e-cigarettes. If you need help quitting, ask your health care provider. Summary  Having a healthy lifestyle and getting preventive care can help to protect your health and wellness after age 81.  Screening and testing are the best way to find a health problem early and help you avoid having a fall. Early diagnosis and treatment give you the best chance for managing medical conditions that are more common for people who are older than age 67.  Falls are a major cause of broken bones and head injuries in people who are older than age 14. Take precautions to prevent a fall at home.  Work with your health care provider to learn what changes you can make to improve your health and wellness and to prevent falls. This information is not intended to replace advice given to you by your health care provider. Make sure you discuss any questions you have with your health care provider. Document Revised: 09/29/2018 Document Reviewed: 04/21/2017 Elsevier Patient Education  [employer]

## 2019-09-04 NOTE — Progress Notes (Signed)
Evelyn Nelson 1951-03-29 [id number]    History:    Presents for annual exam.  Postmenopausal no HRT with no bleeding.  Not sexually active.   History of osteoarthritis both hips replaced one in 2016 102,019 doing well pain-free.  Has had 1 pneumonia vaccine, Shingrix and Tdap current.  Has not had a screening colonoscopy.  2017 T score -1.9 FRAX 18% / 2.8%.  Past medical history, past surgical history, family history and social history were all reviewed and documented in the EPIC chart.  Continues to work full-time, active job and active lifestyle.  2 children both doing well.  Mother recently died at age 73 from [place].  ROS:  A ROS was performed and pertinent positives and negatives are included.  Exam:  Vitals:   09/04/19 1035  BP: 110/78  Weight: 166 lb (75.3 kg)  Height: 5\' 6"  (1.676 m)   Body mass index is 26.79 kg/m.   General appearance:  Normal Thyroid:  Symmetrical, normal in size, without palpable masses or nodularity. Respiratory  Auscultation:  Clear without wheezing or rhonchi Cardiovascular  Auscultation:  Regular rate, without rubs, murmurs or gallops  Edema/varicosities:  Not grossly evident Abdominal  Soft,nontender, without masses, guarding or rebound.  Liver/spleen:  No organomegaly noted  Hernia:  None appreciated  Skin  Inspection:  Grossly normal   Breasts: Examined lying and sitting.     Right: Without masses, retractions, discharge or axillary adenopathy.     Left: Without masses, retractions, discharge or axillary adenopathy. Gentitourinary   Inguinal/mons:  Normal without inguinal adenopathy  External genitalia:  Normal  BUS/Urethra/Skene's glands:  Normal  Vagina: +1 cystocele asymptomatic   Cervix:  Normal  Uterus:  normal in size, shape and contour.  Midline and mobile  Adnexa/parametria:     Rt: Without masses or tenderness.   Lt: Without masses or tenderness.  Anus and perineum: Normal  Digital rectal exam: Normal sphincter tone without  palpated masses or tenderness  Assessment/Plan:  69 y.o. D WF G2, P2 for annual exam with no complaints.  Postmenopausal on no HRT with no bleeding +1 cystocele/asymptomatic Osteoarthritis-bilateral hip replacements/ osteopenia   Plan: Reviewed importance of getting second pneumonia vaccine, screening colonoscopy discussed, and encouraged [facility] information given.  SBEs, continue annual 3D screening mammogram, calcium rich foods, vitamin D 2000 IUs daily.  Home safety, fall prevention and importance of weightbearing and balance type exercise.  Encouraged yoga..  Paps normal, new screening guidelines reviewed.  CBC, CMP, lipid panel.  Normal vitamin D level.Huel Cote [facility], 11:22 AM 09/04/2019

## 2019-09-05 LAB — COMPREHENSIVE METABOLIC PANEL
AG Ratio: 2.2 (calc) (ref 1.0–2.5)
ALT: 15 U/L (ref 6–29)
AST: 24 U/L (ref 10–35)
Albumin: 5 g/dL (ref 3.6–5.1)
Alkaline phosphatase (APISO): 62 U/L (ref 37–153)
BUN: 11 mg/dL (ref 7–25)
CO2: 27 mmol/L (ref 20–32)
Calcium: 10.1 mg/dL (ref 8.6–10.4)
Chloride: 94 mmol/L — ABNORMAL LOW (ref 98–110)
Creat: 0.76 mg/dL (ref 0.60–0.93)
Globulin: 2.3 g/dL (calc) (ref 1.9–3.7)
Glucose, Bld: 92 mg/dL (ref 65–99)
Potassium: 5.2 mmol/L (ref 3.5–5.3)
Sodium: 132 mmol/L — ABNORMAL LOW (ref 135–146)
Total Bilirubin: 0.7 mg/dL (ref 0.2–1.2)
Total Protein: 7.3 g/dL (ref 6.1–8.1)

## 2019-09-05 LAB — CBC WITH DIFFERENTIAL/PLATELET
Absolute Monocytes: 611 cells/uL (ref 200–950)
Basophils Absolute: 69 cells/uL (ref 0–200)
Basophils Relative: 1.1 %
Eosinophils Absolute: 309 cells/uL (ref 15–500)
Eosinophils Relative: 4.9 %
HCT: 42.8 % (ref 35.0–45.0)
Hemoglobin: 14.6 g/dL (ref 11.7–15.5)
Lymphs Abs: 1978 cells/uL (ref 850–3900)
MCH: 31.7 pg (ref 27.0–33.0)
MCHC: 34.1 g/dL (ref 32.0–36.0)
MCV: 92.8 fL (ref 80.0–100.0)
MPV: 8.3 fL (ref 7.5–12.5)
Monocytes Relative: 9.7 %
Neutro Abs: 3333 cells/uL (ref 1500–7800)
Neutrophils Relative %: 52.9 %
Platelets: 337 10*3/uL (ref 140–400)
RBC: 4.61 10*6/uL (ref 3.80–5.10)
RDW: 12.9 % (ref 11.0–15.0)
Total Lymphocyte: 31.4 %
WBC: 6.3 10*3/uL (ref 3.8–10.8)

## 2019-09-05 LAB — LIPID PANEL
Cholesterol: 270 mg/dL — ABNORMAL HIGH (ref <200)
HDL: 138 mg/dL (ref >=50)
LDL Cholesterol (Calc): 116 mg/dL (calc) — ABNORMAL HIGH
Non-HDL Cholesterol (Calc): 132 mg/dL (calc) — ABNORMAL HIGH (ref <130)
Total CHOL/HDL Ratio: 2 (calc) (ref <5.0)
Triglycerides: 67 mg/dL (ref <150)

## 2019-09-06 LAB — URINALYSIS, COMPLETE W/RFL CULTURE
Bacteria, UA: NONE SEEN /HPF
Bilirubin Urine: NEGATIVE
Glucose, UA: NEGATIVE
Hgb urine dipstick: NEGATIVE
Hyaline Cast: NONE SEEN /LPF
Ketones, ur: NEGATIVE
Leukocyte Esterase: NEGATIVE
Nitrites, Initial: NEGATIVE
Protein, ur: NEGATIVE
Specific Gravity, Urine: 1.009 (ref 1.001–1.03)
Squamous Epithelial / LPF: NONE SEEN /HPF (ref <=5)
WBC, UA: NONE SEEN /HPF (ref 0–5)
pH: 8 (ref 5.0–8.0)

## 2019-09-06 LAB — URINE CULTURE
MICRO NUMBER:: 10254079
Result:: NO GROWTH
SPECIMEN QUALITY:: ADEQUATE

## 2019-09-06 LAB — CULTURE INDICATED

## 2020-08-19 ENCOUNTER — Other Ambulatory Visit: Payer: Self-pay | Admitting: Nurse Practitioner

## 2020-08-19 DIAGNOSIS — Z1231 Encounter for screening mammogram for malignant neoplasm of breast: Secondary | ICD-10-CM

## 2020-09-09 ENCOUNTER — Encounter: Payer: Self-pay | Admitting: Nurse Practitioner

## 2020-09-09 ENCOUNTER — Ambulatory Visit: Payer: PPO | Admitting: Nurse Practitioner

## 2020-09-09 ENCOUNTER — Other Ambulatory Visit: Payer: Self-pay

## 2020-09-09 VITALS — BP 130/82 | Ht 65.0 in | Wt 169.0 lb

## 2020-09-09 DIAGNOSIS — Z780 Asymptomatic menopausal state: Secondary | ICD-10-CM

## 2020-09-09 DIAGNOSIS — Z01419 Encounter for gynecological examination (general) (routine) without abnormal findings: Secondary | ICD-10-CM | POA: Diagnosis not present

## 2020-09-09 DIAGNOSIS — N8110 Cystocele, unspecified: Secondary | ICD-10-CM

## 2020-09-09 DIAGNOSIS — M8589 Other specified disorders of bone density and structure, multiple sites: Secondary | ICD-10-CM

## 2020-09-09 NOTE — Progress Notes (Signed)
   Evelyn Nelson September 07, 1950 [id number]   History:  70 y.o. [id number] presents for breast and pelvic exam without GYN complaints. Postmenopausal - no HRT, no bleeding. Normal pap and mammogram history. Osteopenia. Bilateral hip replacements due to OA. Asymptomatic cystocele.   Gynecologic History No LMP recorded. Patient is postmenopausal.   Contraception/Family planning: post menopausal status  Health Maintenance Last Pap: No longer screening per guidelines Last mammogram: 09/04/2019. Results were: normal Last colonoscopy: Never Last Dexa: 2017. Results were: t-score -1.9, FRAX 18% / 2.8%  Past medical history, past surgical history, family history and social history were all reviewed and documented in the EPIC chart.  ROS:  A ROS was performed and pertinent positives and negatives are included.  Exam:  Vitals:   09/09/20 1020  BP: 130/82  Weight: 169 lb (76.7 kg)  Height: 5\' 5"  (1.651 m)   Body mass index is 28.12 kg/m.  General appearance:  Normal Thyroid:  Symmetrical, normal in size, without palpable masses or nodularity. Respiratory  Auscultation:  Clear without wheezing or rhonchi Cardiovascular  Auscultation:  Regular rate, without rubs, murmurs or gallops  Edema/varicosities:  Not grossly evident Abdominal  Soft,nontender, without masses, guarding or rebound.  Liver/spleen:  No organomegaly noted  Hernia:  None appreciated  Skin  Inspection:  Grossly normal   Breasts: Examined lying and sitting.   Right: Without masses, retractions, discharge or axillary adenopathy.   Left: Without masses, retractions, discharge or axillary adenopathy. Gentitourinary   Inguinal/mons:  Normal without inguinal adenopathy  External genitalia:  Normal  BUS/Urethra/Skene's glands:  Normal  Vagina:  Atrophy changes, 1+ cystocele  Cervix:  Normal  Uterus:  Normal in size, shape and contour.  Midline and mobile  Adnexa/parametria:     Rt: Without masses or  tenderness.   Lt: Without masses or tenderness.  Anus and perineum: Normal  Digital rectal exam: Normal sphincter tone without palpated masses or tenderness  Assessment/Plan:  70 y.o. [id number] for breast and pelvic exam.   Well female exam with routine gynecological exam - Education provided on SBEs, importance of preventative screenings, current guidelines, high calcium diet, regular exercise, and multivitamin daily. Labs with PCP.   Osteopenia of multiple sites - Plan: DG Bone Density. Most recent T-score -1.9 in 2017. Taking daily vitamin D supplement and walking/stretching daily.  Postmenopause - Plan: DG Bone Density  Female cystocele - Asymptomatic. We discussed prognosis and management.   Screening for cervical cancer - Normal Pap history. No longer screening per guidelines.  Screening for breast cancer - Normal mammogram history.  Continue annual screenings.  Normal breast exam today.  Screening for colon cancer - Has not had screening colonoscopy. Discussed current guidelines and importance of preventative screenings. We also discussed FOBT. She will consider this.   Return in 1 year for annual.       Tamela Gammon DNP, 10:40 AM 09/09/2020

## 2020-09-09 NOTE — Patient Instructions (Signed)
Health Maintenance After Age 72 After age 72, you are at a higher risk for certain long-term diseases and infections as well as injuries from falls. Falls are a major cause of broken bones and head injuries in people who are older than age 72. Getting regular preventive care can help to keep you healthy and well. Preventive care includes getting regular testing and making lifestyle changes as recommended by your health care provider. Talk with your health care provider about:  Which screenings and tests you should have. A screening is a test that checks for a disease when you have no symptoms.  A diet and exercise plan that is right for you. What should I know about screenings and tests to prevent falls? Screening and testing are the best ways to find a health problem early. Early diagnosis and treatment give you the best chance of managing medical conditions that are common after age 72. Certain conditions and lifestyle choices may make you more likely to have a fall. Your health care provider may recommend:  Regular vision checks. Poor vision and conditions such as cataracts can make you more likely to have a fall. If you wear glasses, make sure to get your prescription updated if your vision changes.  Medicine review. Work with your health care provider to regularly review all of the medicines you are taking, including over-the-counter medicines. Ask your health care provider about any side effects that may make you more likely to have a fall. Tell your health care provider if any medicines that you take make you feel dizzy or sleepy.  Osteoporosis screening. Osteoporosis is a condition that causes the bones to get weaker. This can make the bones weak and cause them to break more easily.  Blood pressure screening. Blood pressure changes and medicines to control blood pressure can make you feel dizzy.  Strength and balance checks. Your health care provider may recommend certain tests to check your  strength and balance while standing, walking, or changing positions.  Foot health exam. Foot pain and numbness, as well as not wearing proper footwear, can make you more likely to have a fall.  Depression screening. You may be more likely to have a fall if you have a fear of falling, feel emotionally low, or feel unable to do activities that you used to do.  Alcohol use screening. Using too much alcohol can affect your balance and may make you more likely to have a fall. What actions can I take to lower my risk of falls? General instructions  Talk with your health care provider about your risks for falling. Tell your health care provider if: ? You fall. Be sure to tell your health care provider about all falls, even ones that seem minor. ? You feel dizzy, sleepy, or off-balance.  Take over-the-counter and prescription medicines only as told by your health care provider. These include any supplements.  Eat a healthy diet and maintain a healthy weight. A healthy diet includes low-fat dairy products, low-fat (lean) meats, and fiber from whole grains, beans, and lots of fruits and vegetables. Home safety  Remove any tripping hazards, such as rugs, cords, and clutter.  Install safety equipment such as grab bars in bathrooms and safety rails on stairs.  Keep rooms and walkways well-lit. Activity  Follow a regular exercise program to stay fit. This will help you maintain your balance. Ask your health care provider what types of exercise are appropriate for you.  If you need a cane or walker,   use it as recommended by your health care provider.  Wear supportive shoes that have nonskid soles.   Lifestyle  Do not drink alcohol if your health care provider tells you not to drink.  If you drink alcohol, limit how much you have: ? 0-1 drink a day for women. ? 0-2 drinks a day for men.  Be aware of how much alcohol is in your drink. In the [place] one drink equals one typical bottle of beer (12  oz), one-half glass of wine (5 oz), or one shot of hard liquor (1 oz).  Do not use any products that contain nicotine or tobacco, such as cigarettes and e-cigarettes. If you need help quitting, ask your health care provider. Summary  Having a healthy lifestyle and getting preventive care can help to protect your health and wellness after age 72.  Screening and testing are the best way to find a health problem early and help you avoid having a fall. Early diagnosis and treatment give you the best chance for managing medical conditions that are more common for people who are older than age 72.  Falls are a major cause of broken bones and head injuries in people who are older than age 72. Take precautions to prevent a fall at home.  Work with your health care provider to learn what changes you can make to improve your health and wellness and to prevent falls. This information is not intended to replace advice given to you by your health care provider. Make sure you discuss any questions you have with your health care provider. Document Revised: 09/29/2018 Document Reviewed: 04/21/2017 Elsevier Patient Education  2021 Elsevier Inc.  

## 2020-09-24 DIAGNOSIS — Z1331 Encounter for screening for depression: Secondary | ICD-10-CM | POA: Diagnosis not present

## 2020-09-24 DIAGNOSIS — E663 Overweight: Secondary | ICD-10-CM | POA: Diagnosis not present

## 2020-09-24 DIAGNOSIS — Z9181 History of falling: Secondary | ICD-10-CM | POA: Diagnosis not present

## 2020-09-24 DIAGNOSIS — E7800 Pure hypercholesterolemia, unspecified: Secondary | ICD-10-CM | POA: Diagnosis not present

## 2020-09-24 DIAGNOSIS — Z6827 Body mass index (BMI) 27.0-27.9, adult: Secondary | ICD-10-CM | POA: Diagnosis not present

## 2020-09-24 DIAGNOSIS — Z79899 Other long term (current) drug therapy: Secondary | ICD-10-CM | POA: Diagnosis not present

## 2020-09-24 DIAGNOSIS — E559 Vitamin D deficiency, unspecified: Secondary | ICD-10-CM | POA: Diagnosis not present

## 2020-09-24 DIAGNOSIS — Z139 Encounter for screening, unspecified: Secondary | ICD-10-CM | POA: Diagnosis not present

## 2020-09-24 DIAGNOSIS — I700 Atherosclerosis of aorta: Secondary | ICD-10-CM | POA: Diagnosis not present

## 2020-10-03 DIAGNOSIS — Z79899 Other long term (current) drug therapy: Secondary | ICD-10-CM | POA: Diagnosis not present

## 2020-10-03 DIAGNOSIS — E7800 Pure hypercholesterolemia, unspecified: Secondary | ICD-10-CM | POA: Diagnosis not present

## 2020-10-03 DIAGNOSIS — E559 Vitamin D deficiency, unspecified: Secondary | ICD-10-CM | POA: Diagnosis not present

## 2020-10-09 ENCOUNTER — Ambulatory Visit: Payer: BC Managed Care – PPO

## 2020-10-21 DIAGNOSIS — E871 Hypo-osmolality and hyponatremia: Secondary | ICD-10-CM | POA: Diagnosis not present

## 2020-10-21 DIAGNOSIS — R7989 Other specified abnormal findings of blood chemistry: Secondary | ICD-10-CM | POA: Diagnosis not present

## 2020-10-29 ENCOUNTER — Other Ambulatory Visit: Payer: Self-pay | Admitting: Nurse Practitioner

## 2020-10-29 ENCOUNTER — Ambulatory Visit (INDEPENDENT_AMBULATORY_CARE_PROVIDER_SITE_OTHER): Payer: PPO

## 2020-10-29 ENCOUNTER — Other Ambulatory Visit: Payer: Self-pay

## 2020-10-29 DIAGNOSIS — Z780 Asymptomatic menopausal state: Secondary | ICD-10-CM

## 2020-10-29 DIAGNOSIS — M8589 Other specified disorders of bone density and structure, multiple sites: Secondary | ICD-10-CM

## 2020-11-01 ENCOUNTER — Ambulatory Visit: Payer: BC Managed Care – PPO

## 2020-11-09 ENCOUNTER — Ambulatory Visit
Admission: RE | Admit: 2020-11-09 | Discharge: 2020-11-09 | Disposition: A | Discharge status: Home / Self Care | Payer: PPO | Source: Ambulatory Visit | Attending: Nurse Practitioner | Admitting: Nurse Practitioner

## 2020-11-09 ENCOUNTER — Other Ambulatory Visit: Payer: Self-pay

## 2020-11-09 DIAGNOSIS — Z1231 Encounter for screening mammogram for malignant neoplasm of breast: Secondary | ICD-10-CM

## 2020-11-21 DIAGNOSIS — R7989 Other specified abnormal findings of blood chemistry: Secondary | ICD-10-CM | POA: Diagnosis not present

## 2020-11-21 DIAGNOSIS — E871 Hypo-osmolality and hyponatremia: Secondary | ICD-10-CM | POA: Diagnosis not present

## 2021-06-16 DIAGNOSIS — R300 Dysuria: Secondary | ICD-10-CM | POA: Diagnosis not present

## 2021-06-16 DIAGNOSIS — N3090 Cystitis, unspecified without hematuria: Secondary | ICD-10-CM | POA: Diagnosis not present

## 2021-09-03 DIAGNOSIS — D492 Neoplasm of unspecified behavior of bone, soft tissue, and skin: Secondary | ICD-10-CM | POA: Diagnosis not present

## 2021-09-03 DIAGNOSIS — L821 Other seborrheic keratosis: Secondary | ICD-10-CM | POA: Diagnosis not present

## 2021-09-03 DIAGNOSIS — B078 Other viral warts: Secondary | ICD-10-CM | POA: Diagnosis not present

## 2021-09-03 DIAGNOSIS — R238 Other skin changes: Secondary | ICD-10-CM | POA: Diagnosis not present

## 2021-09-03 DIAGNOSIS — R208 Other disturbances of skin sensation: Secondary | ICD-10-CM | POA: Diagnosis not present

## 2021-09-10 ENCOUNTER — Ambulatory Visit: Payer: PPO | Admitting: Nurse Practitioner

## 2021-09-11 ENCOUNTER — Encounter: Payer: Self-pay | Admitting: Nurse Practitioner

## 2021-09-11 ENCOUNTER — Ambulatory Visit (INDEPENDENT_AMBULATORY_CARE_PROVIDER_SITE_OTHER): Payer: PPO | Admitting: Nurse Practitioner

## 2021-09-11 ENCOUNTER — Other Ambulatory Visit: Payer: Self-pay

## 2021-09-11 VITALS — BP 124/76 | Ht 65.0 in | Wt 175.0 lb

## 2021-09-11 DIAGNOSIS — Z9189 Other specified personal risk factors, not elsewhere classified: Secondary | ICD-10-CM

## 2021-09-11 DIAGNOSIS — Z01419 Encounter for gynecological examination (general) (routine) without abnormal findings: Secondary | ICD-10-CM

## 2021-09-11 DIAGNOSIS — Z780 Asymptomatic menopausal state: Secondary | ICD-10-CM

## 2021-09-11 DIAGNOSIS — M85832 Other specified disorders of bone density and structure, left forearm: Secondary | ICD-10-CM

## 2021-09-11 NOTE — Progress Notes (Signed)
? ?  Evelyn Nelson [birth year] [id number] ? ? ?History: 71 y.o. [id number] presents for breast and pelvic exam. Postmenopausal - no HRT, no bleeding. Normal pap and mammogram history. H/O bilateral hip replacements due to OA. Asymptomatic cystocele.  ? ?Gynecologic History ?No LMP recorded. Patient is postmenopausal. ?  ?Contraception/Family planning: post menopausal status ?Sexually active: No ? ?Health Maintenance ?Last Pap: 05/20/2015. Results were: Normal ?Last mammogram: 11/09/2020. Results were: Normal ?Last colonoscopy: Never. Reports negative Cologuard 2022 ?Last Dexa: 10/29/2020. Results were: T-score -1.6. No FRAX d/t bilateral hip replacements ? ?Past medical history, past surgical history, family history and social history were all reviewed and documented in the EPIC chart. Divorced. Retired 05/2020. 2 children, live local. 3 grandchildren ages 18-27.  ? ?ROS:  A ROS was performed and pertinent positives and negatives are included. ? ?Exam: ? ?Vitals:  ? 09/11/21 1055  ?BP: 124/76  ?Weight: 175 lb (79.4 kg)  ?Height: '5\' 5"'$  (1.651 m)  ? ? ?Body mass index is 29.12 kg/m?. ? ?General appearance:  Normal ?Thyroid:  Symmetrical, normal in size, without palpable masses or nodularity. ?Respiratory ? Auscultation:  Clear without wheezing or rhonchi ?Cardiovascular ? Auscultation:  Regular rate, without rubs, murmurs or gallops ? Edema/varicosities:  Not grossly evident ?Abdominal ? Soft,nontender, without masses, guarding or rebound. ? Liver/spleen:  No organomegaly noted ? Hernia:  None appreciated ? Skin ? Inspection:  Grossly normal ?  ?Breasts: Examined lying and sitting.  ? Right: Without masses, retractions, discharge or axillary adenopathy. ? ? Left: Without masses, retractions, discharge or axillary adenopathy. ?Gentitourinary  ? Inguinal/mons:  Normal without inguinal adenopathy ? External genitalia:  Normal ? BUS/Urethra/Skene's glands:  Normal ? Vagina:  Atrophy changes, 1+ cystocele ? Cervix:   Normal ? Uterus:  Normal in size, shape and contour.  Midline and mobile ? Adnexa/parametria:   ?  Rt: Without masses or tenderness. ?  Lt: Without masses or tenderness. ? Anus and perineum: Normal ? Digital rectal exam: Normal sphincter tone without palpated masses or tenderness ? ?Patient informed chaperone available to be present for breast and pelvic exam. Patient has requested no chaperone to be present. Patient has been advised what will be completed during breast and pelvic exam.  ? ?Assessment/Plan:  71 y.o. [id number] for breast and pelvic exam.  ? ?Well female exam with routine gynecological exam - Education provided on SBEs, importance of preventative screenings, current guidelines, high calcium diet, regular exercise, and multivitamin daily. Labs with PCP.  ? ?Osteopenia of multiple sites - Plan: DG Bone Density. Most recent T-score -1.6 in May 2022. Continue daily vitamin D + Calcium supplement and regular exercise. She walks a few days a week and goes to group classes. ? ?Postmenopausal - no HRT, no bleeding ? ?Screening for cervical cancer - Normal Pap history. No longer screening per guidelines. ? ?Screening for breast cancer - Normal mammogram history.  Continue annual screenings.  Normal breast exam today. ? ?Screening for colon cancer -  Has never had colonoscopy. Reports negative Cologuard last year.  ? ?Return in 1 year for breast and pelvic.  ? ? ? ? ? ?Tamela Gammon DNP, 11:44 AM 09/11/2021 ? ?

## 2021-10-02 DIAGNOSIS — L304 Erythema intertrigo: Secondary | ICD-10-CM | POA: Diagnosis not present

## 2021-10-02 DIAGNOSIS — E871 Hypo-osmolality and hyponatremia: Secondary | ICD-10-CM | POA: Diagnosis not present

## 2021-10-02 DIAGNOSIS — E7800 Pure hypercholesterolemia, unspecified: Secondary | ICD-10-CM | POA: Diagnosis not present

## 2021-10-02 DIAGNOSIS — E559 Vitamin D deficiency, unspecified: Secondary | ICD-10-CM | POA: Diagnosis not present

## 2021-10-02 DIAGNOSIS — N390 Urinary tract infection, site not specified: Secondary | ICD-10-CM | POA: Diagnosis not present

## 2021-10-06 ENCOUNTER — Other Ambulatory Visit: Payer: Self-pay | Admitting: Nurse Practitioner

## 2021-10-06 DIAGNOSIS — Z1231 Encounter for screening mammogram for malignant neoplasm of breast: Secondary | ICD-10-CM

## 2021-10-09 DIAGNOSIS — E559 Vitamin D deficiency, unspecified: Secondary | ICD-10-CM | POA: Diagnosis not present

## 2021-10-09 DIAGNOSIS — Z9181 History of falling: Secondary | ICD-10-CM | POA: Diagnosis not present

## 2021-10-09 DIAGNOSIS — Z139 Encounter for screening, unspecified: Secondary | ICD-10-CM | POA: Diagnosis not present

## 2021-10-09 DIAGNOSIS — E7800 Pure hypercholesterolemia, unspecified: Secondary | ICD-10-CM | POA: Diagnosis not present

## 2021-10-09 DIAGNOSIS — Z1331 Encounter for screening for depression: Secondary | ICD-10-CM | POA: Diagnosis not present

## 2021-10-09 DIAGNOSIS — I700 Atherosclerosis of aorta: Secondary | ICD-10-CM | POA: Diagnosis not present

## 2021-11-13 ENCOUNTER — Ambulatory Visit: Payer: PPO

## 2021-11-20 ENCOUNTER — Ambulatory Visit
Admission: RE | Admit: 2021-11-20 | Discharge: 2021-11-20 | Disposition: A | Discharge status: Home / Self Care | Payer: PPO | Source: Ambulatory Visit | Attending: Nurse Practitioner | Admitting: Nurse Practitioner

## 2021-11-20 DIAGNOSIS — Z1231 Encounter for screening mammogram for malignant neoplasm of breast: Secondary | ICD-10-CM

## 2022-03-18 DIAGNOSIS — R059 Cough, unspecified: Secondary | ICD-10-CM | POA: Diagnosis not present

## 2022-03-18 DIAGNOSIS — J329 Chronic sinusitis, unspecified: Secondary | ICD-10-CM | POA: Diagnosis not present

## 2022-07-13 ENCOUNTER — Ambulatory Visit (INDEPENDENT_AMBULATORY_CARE_PROVIDER_SITE_OTHER): Payer: PPO

## 2022-07-13 ENCOUNTER — Ambulatory Visit: Payer: PPO | Admitting: Orthopaedic Surgery

## 2022-07-13 DIAGNOSIS — M21062 Valgus deformity, not elsewhere classified, left knee: Secondary | ICD-10-CM

## 2022-07-13 DIAGNOSIS — M25562 Pain in left knee: Secondary | ICD-10-CM

## 2022-07-13 DIAGNOSIS — M25511 Pain in right shoulder: Secondary | ICD-10-CM | POA: Diagnosis not present

## 2022-07-13 DIAGNOSIS — G8929 Other chronic pain: Secondary | ICD-10-CM

## 2022-07-13 DIAGNOSIS — M12811 Other specific arthropathies, not elsewhere classified, right shoulder: Secondary | ICD-10-CM

## 2022-07-13 MED ORDER — METHYLPREDNISOLONE ACETATE 40 MG/ML IJ SUSP
40.0000 mg | INTRAMUSCULAR | Status: AC | PRN
  Administered 2022-07-13: 40 mg via INTRA_ARTICULAR

## 2022-07-13 MED ORDER — LIDOCAINE HCL 1 % IJ SOLN
3.0000 mL | INTRAMUSCULAR | Status: AC | PRN
  Administered 2022-07-13: 3 mL

## 2022-07-13 NOTE — Progress Notes (Signed)
Office Visit Note   Patient: Evelyn Nelson           Date of Birth: [birth year]           MRN: [id number] Visit Date: 07/13/2022              Requested by: Cyndi Bender, PA-C [address] [place],  [place] [address] PCP: Cyndi Bender, PA-C   Assessment & Plan: Visit Diagnoses:  1. Chronic pain of left knee   2. Chronic right shoulder pain   3. Rotator cuff arthropathy of right shoulder   4. Acquired valgus deformity of knee, left     Plan: For the shoulder on the right side, we did recommend trying at least a one-time subacromial steroid injection to see if this will a some of her pain.  She understands the only surgical treatment if the pain gets bad enough would be considering a reverse shoulder arthroplasty.  I am concerned about her left knee valgus malalignment and her being a fall risk.  I recommend a cane in her opposite right hand and also at least a hinged knee brace for some support.  I would like to obtain an MRI of the left knee to evaluate the cartilage and ligaments because I do feel that the only surgery that could help her left knee would be a replacement.  We will see her back in follow-up once we have the MRI of her left knee.  All questions and concerns were answered and addressed.  Follow-Up Instructions: Return in about 2 weeks (around 07/27/2022).   Orders:  Orders Placed This Encounter  Procedures   Large Joint Inj   XR Knee 1-2 Views Left   XR Shoulder Right   No orders of the defined types were placed in this encounter.     Procedures: Large Joint Inj: R subacromial bursa on 07/13/2022 4:44 PM Indications: pain and diagnostic evaluation Details: 22 G 1.5 in needle  Arthrogram: No  Medications: 3 mL lidocaine 1 %; 40 mg methylPREDNISolone acetate 40 MG/ML Outcome: tolerated well, no immediate complications Procedure, treatment alternatives, risks and benefits explained, specific risks discussed. Consent was given by the patient.  Immediately prior to procedure a time out was called to verify the correct patient, procedure, equipment, support staff and site/side marked as required. Patient was prepped and draped in the usual sterile fashion.       Clinical Data: No additional findings.   Subjective: Chief Complaint  Patient presents with   Right Shoulder - Pain   Left Knee - Pain  Evelyn Nelson comes in today to evaluate her right shoulder and her left knee.  Her daughter Janace Hoard is good friends with [place] and is a scrub tech in the [place], [place].  I have replaced Evelyn Nelson's hips.  She has been having some difficulty walking due to her left knee being malaligned.  She also has been dealing with her dominant right shoulder hurting and having difficulty performing her actives daily living such as washing her hair with some discomfort in that shoulder.  She needs both of these areas evaluated today.  She denies any issue with either hip.  She does report some discomfort in the back of her left knee.  She does still exercise and walks on a regular basis.  HPI  Review of Systems There is no listed fever, chills, nausea, vomiting  Objective: Vital Signs: There were no vitals taken for this visit.  Physical Exam She is alert  and orient x 3 and in no acute distress Ortho Exam Examination of her right shoulder shows an obvious rotator cuff deficient shoulder.  She is able to abduct and fully forward flex the shoulder but she uses her deltoid a lot to do so.  There is no significant grinding of the glenohumeral joint but there is definitely weakness in the cuff as well.  Examination of her left knee with walking shows significant valgus malalignment of that left knee.  The right knee appears straight but the left knee is definitely in a valgus position. Specialty Comments:  No specialty comments available.  Imaging: XR Shoulder Right  Result Date: 07/13/2022 3 views of the right shoulder show evidence of rotator cuff  arthropathy.  The humeral head is high riding within the glenoid humeral joint.  XR Knee 1-2 Views Left  Result Date: 07/13/2022 Standing views of the left knee shows lateral joint space narrowing and valgus malalignment.  There is arthritic changes as well.    PMFS History: Patient Active Problem List   Diagnosis Date Noted   Status post total replacement of left hip 03/08/2018   Unilateral primary osteoarthritis, left hip 01/10/2018   Pain in left hip 08/18/2017   Left-sided low back pain with left-sided sciatica 08/18/2017   Left wrist pain 05/18/2016   Closed fracture of left distal radius and ulna, with routine healing, subsequent encounter 04/17/2016   Osteoarthritis of right hip 04/09/2015   Status post total replacement of right hip 04/09/2015   Osteopenia 03/17/2012   Past Medical History:  Diagnosis Date   Arthritis    OA, back (epidural injection x2)  & hip   Complication of anesthesia    Osteopenia 05/2016   T score -1.9 FRAX 18%/2.8%   PONV (postoperative nausea and vomiting)     Family History  Problem Relation Age of Onset   Lung cancer Father    Hodgkin's lymphoma Son     Past Surgical History:  Procedure Laterality Date   TONSILLECTOMY     TOTAL HIP ARTHROPLASTY Right 04/09/2015   Procedure: RIGHT TOTAL HIP ARTHROPLASTY ANTERIOR APPROACH;  Surgeon: Mcarthur Rossetti, MD;  Location: [facility];  Service: Orthopedics;  Laterality: Right;  NEEDS RNFA   TOTAL HIP ARTHROPLASTY Left 03/08/2018   TOTAL HIP ARTHROPLASTY Left 03/08/2018   Procedure: LEFT TOTAL HIP ARTHROPLASTY ANTERIOR APPROACH;  Surgeon: Mcarthur Rossetti, MD;  Location: [facility];  Service: Orthopedics;  Laterality: Left;   TUBAL LIGATION  1984   Social History   Occupational History   Not on file  Tobacco Use   Smoking status: Never   Smokeless tobacco: Never  Vaping Use   Vaping Use: Never used  Substance and Sexual Activity   Alcohol use: Yes    Comment: Occas beer   Drug use: No    Sexual activity: Not Currently    Comment: 1st intercourse 13yo-5 partners

## 2022-07-14 ENCOUNTER — Other Ambulatory Visit: Payer: Self-pay

## 2022-07-14 DIAGNOSIS — G8929 Other chronic pain: Secondary | ICD-10-CM

## 2022-07-24 ENCOUNTER — Ambulatory Visit
Admission: RE | Admit: 2022-07-24 | Discharge: 2022-07-24 | Disposition: A | Discharge status: Home / Self Care | Payer: PPO | Source: Ambulatory Visit | Attending: Orthopaedic Surgery | Admitting: Orthopaedic Surgery

## 2022-07-24 DIAGNOSIS — M25462 Effusion, left knee: Secondary | ICD-10-CM | POA: Diagnosis not present

## 2022-07-24 DIAGNOSIS — R600 Localized edema: Secondary | ICD-10-CM | POA: Diagnosis not present

## 2022-07-24 DIAGNOSIS — M1712 Unilateral primary osteoarthritis, left knee: Secondary | ICD-10-CM | POA: Diagnosis not present

## 2022-07-24 DIAGNOSIS — G8929 Other chronic pain: Secondary | ICD-10-CM

## 2022-08-05 ENCOUNTER — Encounter: Payer: Self-pay | Admitting: Orthopaedic Surgery

## 2022-08-05 ENCOUNTER — Ambulatory Visit (INDEPENDENT_AMBULATORY_CARE_PROVIDER_SITE_OTHER): Payer: PPO | Admitting: Orthopaedic Surgery

## 2022-08-05 ENCOUNTER — Telehealth: Payer: Self-pay | Admitting: Physician Assistant

## 2022-08-05 DIAGNOSIS — M1712 Unilateral primary osteoarthritis, left knee: Secondary | ICD-10-CM | POA: Diagnosis not present

## 2022-08-05 DIAGNOSIS — M216X2 Other acquired deformities of left foot: Secondary | ICD-10-CM

## 2022-08-05 DIAGNOSIS — G8929 Other chronic pain: Secondary | ICD-10-CM | POA: Diagnosis not present

## 2022-08-05 DIAGNOSIS — M25511 Pain in right shoulder: Secondary | ICD-10-CM

## 2022-08-05 NOTE — Telephone Encounter (Signed)
08/05/22 ov note faxed to [facility]. Patient has upcoming appointment. Faxed [phone]

## 2022-08-05 NOTE — Progress Notes (Signed)
HPI: [name] returns today to go over the MRI of her left knee.  She continues to have some discomfort in the knee but is mostly posterior aspect of the knee.  Her main complaint is the varus deformity of her knee which she states gotten worse and causes her to have difficulty ambulating.  She notes she did have a recent fall and fell onto the left knee and this was prior to the MRI.  She is also back for follow-up of her right shoulder status post injection 07/13/2022.  She states right shoulder pain is improved.  She still has difficulty with combing her hair.  She is having no radicular symptoms down the arm though.  Radiographs of her right shoulder at last office visit did show high riding humeral head consistent with rotator cuff arthropathy.  Bone appears osteopenic.   MRI MRI of the left knee is reviewed with the patient and her daughters present today.  This shows some mild arthritic changes of all 3 compartments no full-thickness cartilage loss though.  There is an area of the knee.  Bone marrow edema anterior proximal tibia lateral aspect.  No frank fracture though.  Ligaments and meniscus intact.  Large Baker's cyst present.  Slight effusion.   Review of systems see HPI otherwise negative  Physical exam: General well-developed well-nourished female no acute distress walks with a slight antalgic gait no obvious varus deformity of the left knee. Psych: Alert and oriented x 3 Bilateral shoulders: 5 out of 5 strength with external and internal rotation against resistance.  She is able using her deltoid to get her arm above her head on the right.  She has weakness on the right with empty can test.  Liftoff test is negative bilaterally but very uncomfortable perform on both right.  Left knee: Good range of motion.  Mild effusion.  No abnormal warmth erythema.  No instability valgus varus stressing.  Obvious varus deformity of the knee.  Nontender along the lateral proximal tibia.  Tenderness over  the medial joint line of the left knee and pes anserinus region.  Calf supple nontender.  Bilateral feet she has full dorsiflexion plantarflexion ankles.  She has 5 out of 5 strengths with inversion eversion against resistance bilateral feet except for the left foot that she has slight weakness with inversion of the foot against resistance.  Slight tenderness over the left posterior tibial tendon region.  She has obvious planovalgus deformity of the left foot with too many toes sign.  She is able to perform single heel lift bilaterally slightly with a bit more effort to perform on the left compared to the right.   Impression: Left pes planovalgus deformity with intact posterior tibial tendon Left knee mild arthritic changes with acute subchondral edema lateral plateau Right shoulder rotator cuff arthropathy  Plan: Will send her to [facility] for her shoulder to work on range of motion and hopefully decrease pain.  She will gain her home exercise program.  She can have subacromial injections in the shoulder as needed.  If she develops pain that is in her intractable to conservative measures would recommend evaluation for possible reverse right shoulder replacement but this is only if she is having pain that is not responding well to conservative management.  In regards to her left knee currently she is having no significant pain in the knee main complaint is the varus deformity.  Will have her obtain medial arch support for her left shoe feel that  this will help support her posterior tibial tendon and also help with the varus malalignment of her lower leg.  She will follow-up with [place] on an as-needed basis pain persist or becomes worse.  Questions were encouraged and answered at length.

## 2022-08-18 DIAGNOSIS — M25511 Pain in right shoulder: Secondary | ICD-10-CM | POA: Diagnosis not present

## 2022-08-18 DIAGNOSIS — M6281 Muscle weakness (generalized): Secondary | ICD-10-CM | POA: Diagnosis not present

## 2022-08-24 DIAGNOSIS — M25511 Pain in right shoulder: Secondary | ICD-10-CM | POA: Diagnosis not present

## 2022-08-24 DIAGNOSIS — M6281 Muscle weakness (generalized): Secondary | ICD-10-CM | POA: Diagnosis not present

## 2022-08-27 DIAGNOSIS — M25511 Pain in right shoulder: Secondary | ICD-10-CM | POA: Diagnosis not present

## 2022-08-27 DIAGNOSIS — M6281 Muscle weakness (generalized): Secondary | ICD-10-CM | POA: Diagnosis not present

## 2022-08-31 DIAGNOSIS — M6281 Muscle weakness (generalized): Secondary | ICD-10-CM | POA: Diagnosis not present

## 2022-08-31 DIAGNOSIS — M25511 Pain in right shoulder: Secondary | ICD-10-CM | POA: Diagnosis not present

## 2022-09-03 DIAGNOSIS — M25511 Pain in right shoulder: Secondary | ICD-10-CM | POA: Diagnosis not present

## 2022-09-03 DIAGNOSIS — M6281 Muscle weakness (generalized): Secondary | ICD-10-CM | POA: Diagnosis not present

## 2022-09-09 DIAGNOSIS — M25511 Pain in right shoulder: Secondary | ICD-10-CM | POA: Diagnosis not present

## 2022-09-09 DIAGNOSIS — M6281 Muscle weakness (generalized): Secondary | ICD-10-CM | POA: Diagnosis not present

## 2022-09-14 DIAGNOSIS — M25511 Pain in right shoulder: Secondary | ICD-10-CM | POA: Diagnosis not present

## 2022-09-14 DIAGNOSIS — M6281 Muscle weakness (generalized): Secondary | ICD-10-CM | POA: Diagnosis not present

## 2022-09-15 ENCOUNTER — Ambulatory Visit (INDEPENDENT_AMBULATORY_CARE_PROVIDER_SITE_OTHER): Payer: PPO | Admitting: Nurse Practitioner

## 2022-09-15 ENCOUNTER — Encounter: Payer: Self-pay | Admitting: Nurse Practitioner

## 2022-09-15 VITALS — BP 108/64 | HR 78 | Ht 64.75 in | Wt 170.0 lb

## 2022-09-15 DIAGNOSIS — Z780 Asymptomatic menopausal state: Secondary | ICD-10-CM

## 2022-09-15 DIAGNOSIS — N393 Stress incontinence (female) (male): Secondary | ICD-10-CM | POA: Diagnosis not present

## 2022-09-15 DIAGNOSIS — B009 Herpesviral infection, unspecified: Secondary | ICD-10-CM | POA: Diagnosis not present

## 2022-09-15 DIAGNOSIS — M8589 Other specified disorders of bone density and structure, multiple sites: Secondary | ICD-10-CM | POA: Diagnosis not present

## 2022-09-15 DIAGNOSIS — Z01419 Encounter for gynecological examination (general) (routine) without abnormal findings: Secondary | ICD-10-CM

## 2022-09-15 DIAGNOSIS — Z9189 Other specified personal risk factors, not elsewhere classified: Secondary | ICD-10-CM | POA: Diagnosis not present

## 2022-09-15 DIAGNOSIS — Z9289 Personal history of other medical treatment: Secondary | ICD-10-CM

## 2022-09-15 NOTE — Progress Notes (Signed)
Jessicalynn Kingcade 07/15/1950 [id number]   History:  72 y.o. G2P2002 presents for breast and pelvic exam. Postmenopausal - no HRT, no bleeding. Normal pap and mammogram history. H/O bilateral hip replacements due to OA. Some stress incontinence, not new for her. Denies nocturia.   Gynecologic History No LMP recorded. Patient is postmenopausal.   Contraception/Family planning: post menopausal status Sexually active: No  Health Maintenance Last Pap: 05/20/2015. Results were: Normal Last mammogram: 11/20/2021. Results were: Normal Last colonoscopy: Never. Reports Negative Cologuard 2022 Last Dexa: 10/29/2020. Results were: T-score -1.6. No FRAX d/t bilateral hip replacements  Past medical history, past surgical history, family history and social history were all reviewed and documented in the EPIC chart. Divorced. Retired 05/2020. 2 children, live local. 3 grandchildren ages 19-28.   ROS:  A ROS was performed and pertinent positives and negatives are included.  Exam:  Vitals:   09/15/22 1039  BP: 108/64  Pulse: 78  SpO2: 99%  Weight: 170 lb (77.1 kg)  Height: 5' 4.75" (1.645 m)     Body mass index is 28.51 kg/m.  General appearance:  Normal Thyroid:  Symmetrical, normal in size, without palpable masses or nodularity. Respiratory  Auscultation:  Clear without wheezing or rhonchi Cardiovascular  Auscultation:  Regular rate, without rubs, murmurs or gallops  Edema/varicosities:  Not grossly evident Abdominal  Soft,nontender, without masses, guarding or rebound.  Liver/spleen:  No organomegaly noted  Hernia:  None appreciated  Skin  Inspection:  Grossly normal   Breasts: Examined lying and sitting.   Right: Without masses, retractions, discharge or axillary adenopathy.   Left: Without masses, retractions, discharge or axillary adenopathy. Genitourinary   Inguinal/mons:  Normal without inguinal adenopathy  External genitalia:  Normal appearing vulva with no masses,  tenderness, or lesions  BUS/Urethra/Skene's glands:  Normal  Vagina:  Normal appearing with normal color and discharge, no lesions. Atrophic, 1+ cystocele  Cervix:  Normal appearing without discharge or lesions  Uterus:  Normal in size, shape and contour.  Midline and mobile, nontender  Adnexa/parametria:     Rt: Normal in size, without masses or tenderness.   Lt: Normal in size, without masses or tenderness.  Anus and perineum: Normal  Digital rectal exam: Deferred  Patient informed chaperone available to be present for breast and pelvic exam. Patient has requested no chaperone to be present. Patient has been advised what will be completed during breast and pelvic exam.   Assessment/Plan:  72 y.o. G2P2002 for breast and pelvic exam.   Encounter for breast and pelvic examination - Education provided on SBEs, importance of preventative screenings, current guidelines, high calcium diet, regular exercise, and multivitamin daily. Labs with PCP.   Osteopenia of multiple sites - Plan: DG Bone Density. Most recent T-score -1.6 in May 2022. Continue daily vitamin D + Calcium supplement and regular exercise. She walks a few days a week and goes to group classes.  Postmenopausal - no HRT, no bleeding  Stress incontinence - This is not new for her. Mostly with activity. Recommend voiding prior to exercising, avoid letting bladder become too full, void more often, pelvic floor strengthening exercises.   Screening for cervical cancer - Normal Pap history. No longer screening per guidelines.  Screening for breast cancer - Normal mammogram history.  Continue annual screenings.  Normal breast exam today.  Screening for colon cancer -  Has never had colonoscopy. Reports negative Cologuard 2022.  Return in 1 year for breast and pelvic.       Calix Heinbaugh  Armstead Peaks DNP, 11:09 AM 09/15/2022

## 2022-09-16 DIAGNOSIS — M25511 Pain in right shoulder: Secondary | ICD-10-CM | POA: Diagnosis not present

## 2022-09-16 DIAGNOSIS — M6281 Muscle weakness (generalized): Secondary | ICD-10-CM | POA: Diagnosis not present

## 2022-09-23 DIAGNOSIS — M6281 Muscle weakness (generalized): Secondary | ICD-10-CM | POA: Diagnosis not present

## 2022-09-23 DIAGNOSIS — M25511 Pain in right shoulder: Secondary | ICD-10-CM | POA: Diagnosis not present

## 2022-09-28 DIAGNOSIS — M6281 Muscle weakness (generalized): Secondary | ICD-10-CM | POA: Diagnosis not present

## 2022-09-28 DIAGNOSIS — M25511 Pain in right shoulder: Secondary | ICD-10-CM | POA: Diagnosis not present

## 2022-09-30 DIAGNOSIS — M25511 Pain in right shoulder: Secondary | ICD-10-CM | POA: Diagnosis not present

## 2022-09-30 DIAGNOSIS — M6281 Muscle weakness (generalized): Secondary | ICD-10-CM | POA: Diagnosis not present

## 2022-10-05 DIAGNOSIS — M25511 Pain in right shoulder: Secondary | ICD-10-CM | POA: Diagnosis not present

## 2022-10-05 DIAGNOSIS — M6281 Muscle weakness (generalized): Secondary | ICD-10-CM | POA: Diagnosis not present

## 2022-10-08 DIAGNOSIS — M6281 Muscle weakness (generalized): Secondary | ICD-10-CM | POA: Diagnosis not present

## 2022-10-08 DIAGNOSIS — M25511 Pain in right shoulder: Secondary | ICD-10-CM | POA: Diagnosis not present

## 2022-10-12 DIAGNOSIS — M6281 Muscle weakness (generalized): Secondary | ICD-10-CM | POA: Diagnosis not present

## 2022-10-12 DIAGNOSIS — M25511 Pain in right shoulder: Secondary | ICD-10-CM | POA: Diagnosis not present

## 2022-11-03 ENCOUNTER — Other Ambulatory Visit: Payer: Self-pay | Admitting: Nurse Practitioner

## 2022-11-03 ENCOUNTER — Ambulatory Visit (INDEPENDENT_AMBULATORY_CARE_PROVIDER_SITE_OTHER): Payer: PPO

## 2022-11-03 DIAGNOSIS — Z9189 Other specified personal risk factors, not elsewhere classified: Secondary | ICD-10-CM

## 2022-11-03 DIAGNOSIS — Z01419 Encounter for gynecological examination (general) (routine) without abnormal findings: Secondary | ICD-10-CM

## 2022-11-03 DIAGNOSIS — Z13820 Encounter for screening for osteoporosis: Secondary | ICD-10-CM | POA: Diagnosis not present

## 2022-11-03 DIAGNOSIS — Z780 Asymptomatic menopausal state: Secondary | ICD-10-CM | POA: Diagnosis not present

## 2022-11-03 DIAGNOSIS — N393 Stress incontinence (female) (male): Secondary | ICD-10-CM

## 2022-11-03 DIAGNOSIS — M85831 Other specified disorders of bone density and structure, right forearm: Secondary | ICD-10-CM

## 2022-11-03 DIAGNOSIS — Z9289 Personal history of other medical treatment: Secondary | ICD-10-CM

## 2022-11-03 DIAGNOSIS — M8589 Other specified disorders of bone density and structure, multiple sites: Secondary | ICD-10-CM

## 2022-11-12 ENCOUNTER — Other Ambulatory Visit: Payer: Self-pay | Admitting: Nurse Practitioner

## 2022-11-12 DIAGNOSIS — Z1231 Encounter for screening mammogram for malignant neoplasm of breast: Secondary | ICD-10-CM

## 2022-12-03 ENCOUNTER — Ambulatory Visit
Admission: RE | Admit: 2022-12-03 | Discharge: 2022-12-03 | Disposition: A | Discharge status: Home / Self Care | Payer: PPO | Source: Ambulatory Visit | Attending: Nurse Practitioner | Admitting: Nurse Practitioner

## 2022-12-03 DIAGNOSIS — Z1231 Encounter for screening mammogram for malignant neoplasm of breast: Secondary | ICD-10-CM | POA: Diagnosis not present

## 2023-03-10 DIAGNOSIS — L821 Other seborrheic keratosis: Secondary | ICD-10-CM | POA: Diagnosis not present

## 2023-03-10 DIAGNOSIS — L820 Inflamed seborrheic keratosis: Secondary | ICD-10-CM | POA: Diagnosis not present

## 2023-03-10 DIAGNOSIS — L814 Other melanin hyperpigmentation: Secondary | ICD-10-CM | POA: Diagnosis not present

## 2023-03-10 DIAGNOSIS — D492 Neoplasm of unspecified behavior of bone, soft tissue, and skin: Secondary | ICD-10-CM | POA: Diagnosis not present

## 2023-03-10 DIAGNOSIS — L298 Other pruritus: Secondary | ICD-10-CM | POA: Diagnosis not present

## 2023-03-10 DIAGNOSIS — L239 Allergic contact dermatitis, unspecified cause: Secondary | ICD-10-CM | POA: Diagnosis not present

## 2023-04-12 DIAGNOSIS — R300 Dysuria: Secondary | ICD-10-CM | POA: Diagnosis not present

## 2023-04-12 DIAGNOSIS — N3091 Cystitis, unspecified with hematuria: Secondary | ICD-10-CM | POA: Diagnosis not present

## 2023-04-12 DIAGNOSIS — R350 Frequency of micturition: Secondary | ICD-10-CM | POA: Diagnosis not present

## 2023-06-01 DIAGNOSIS — L2989 Other pruritus: Secondary | ICD-10-CM | POA: Diagnosis not present

## 2023-06-01 DIAGNOSIS — L538 Other specified erythematous conditions: Secondary | ICD-10-CM | POA: Diagnosis not present

## 2023-06-01 DIAGNOSIS — L820 Inflamed seborrheic keratosis: Secondary | ICD-10-CM | POA: Diagnosis not present

## 2023-06-01 DIAGNOSIS — D492 Neoplasm of unspecified behavior of bone, soft tissue, and skin: Secondary | ICD-10-CM | POA: Diagnosis not present

## 2023-09-06 ENCOUNTER — Ambulatory Visit: Admitting: Physician Assistant

## 2023-09-06 ENCOUNTER — Other Ambulatory Visit: Payer: Self-pay

## 2023-09-06 ENCOUNTER — Encounter: Payer: Self-pay | Admitting: Physician Assistant

## 2023-09-06 DIAGNOSIS — M25562 Pain in left knee: Secondary | ICD-10-CM | POA: Diagnosis not present

## 2023-09-06 MED ORDER — METHYLPREDNISOLONE ACETATE 40 MG/ML IJ SUSP
40.0000 mg | INTRAMUSCULAR | Status: AC | PRN
  Administered 2023-09-06: 40 mg via INTRA_ARTICULAR

## 2023-09-06 MED ORDER — LIDOCAINE HCL 1 % IJ SOLN
3.0000 mL | INTRAMUSCULAR | Status: AC | PRN
  Administered 2023-09-06: 3 mL

## 2023-09-06 NOTE — Progress Notes (Signed)
 Office Visit Note   Patient: Evelyn Nelson           Date of Birth: 02-Oct-1950           MRN: [id number] Visit Date: 09/06/2023              Requested by: Lonie Peak, PA-C [address] [place],  [place] [address] PCP: Lonie Peak, PA-C   Assessment & Plan: Visit Diagnoses:  1. Left knee pain, unspecified chronicity     Plan: She will work on quad strengthening knee friendly exercises were discussed.  Follow-up as needed.  She may benefit from viscosupplementation injection if she fails conservative treatment with cortisone injections  Follow-Up Instructions: Return if symptoms worsen or fail to improve.   Orders:  Orders Placed This Encounter  Procedures   Large Joint Inj: R knee   XR Knee 1-2 Views Left   No orders of the defined types were placed in this encounter.     Procedures: Large Joint Inj: R knee on 09/06/2023 12:07 PM Indications: pain Details: 22 G 1.5 in needle, superolateral approach  Arthrogram: No  Medications: 3 mL lidocaine 1 %; 40 mg methylPREDNISolone acetate 40 MG/ML Aspirate: 5 mL yellow Outcome: tolerated well, no immediate complications Procedure, treatment alternatives, risks and benefits explained, specific risks discussed. Consent was given by the patient. Immediately prior to procedure a time out was called to verify the correct patient, procedure, equipment, support staff and site/side marked as required. Patient was prepped and draped in the usual sterile fashion.       Clinical Data: No additional findings.   Subjective: Chief Complaint  Patient presents with   Left Knee - Pain    HPI Ms. Motter returns today due to left knee pain.  She feels a lump in the back of her knee.  Prior MRI did show she had a Baker's cyst.  She states this area is tender to touch.  Pain only with ambulation past 3 weeks no known injury.  No giving way or other mechanical symptoms.  Notes some swelling.  She states she has had no  change in her overall health status.  She has been using occasional ibuprofen for the pain. Review of Systems See HPI otherwise negative  Objective: Vital Signs: There were no vitals taken for this visit.  Physical Exam General: Well-developed well-nourished female no acute distress mood affect appropriate.  Psych alert and oriented x 3.  Respirations unlabored Ortho Exam Bilateral knees: Good range of motion both knees.  Left knee slight valgus deformity.  No instability valgus varus stressing of either knee.  Bilateral knees without any abnormal warmth or erythema.  Slight effusion left knee only.  Patellofemoral crepitus left knee.  Calfs are supple and nontender bilaterally. Specialty Comments:  No specialty comments available.  Imaging: Left knee 2 views: Knee is well located.  Valgus deformity.  Moderate narrowing medial joint line.  Mild patellofemoral changes medial compartment overall well-preserved.  No acute fractures or bony abnormalities.   PMFS History: Patient Active Problem List   Diagnosis Date Noted   Status post total replacement of left hip 03/08/2018   Unilateral primary osteoarthritis, left hip 01/10/2018   Pain in left hip 08/18/2017   Left-sided low back pain with left-sided sciatica 08/18/2017   Left wrist pain 05/18/2016   Closed fracture of left distal radius and ulna, with routine healing, subsequent encounter 04/17/2016   Osteoarthritis of right hip 04/09/2015   Status post total replacement of  right hip 04/09/2015   Osteopenia 03/17/2012   Past Medical History:  Diagnosis Date   Arthritis    OA, back (epidural injection x2)  & hip   Complication of anesthesia    HSV infection    oral   Osteopenia 05/2016   T score -1.9 FRAX 18%/2.8%   PONV (postoperative nausea and vomiting)     Family History  Problem Relation Age of Onset   Lung cancer Father    Hodgkin's lymphoma Son     Past Surgical History:  Procedure Laterality Date   TONSILLECTOMY      TOTAL HIP ARTHROPLASTY Right 04/09/2015   Procedure: RIGHT TOTAL HIP ARTHROPLASTY ANTERIOR APPROACH;  Surgeon: Kathryne Hitch, MD;  Location: [facility] OR;  Service: Orthopedics;  Laterality: Right;  NEEDS RNFA   TOTAL HIP ARTHROPLASTY Left 03/08/2018   TOTAL HIP ARTHROPLASTY Left 03/08/2018   Procedure: LEFT TOTAL HIP ARTHROPLASTY ANTERIOR APPROACH;  Surgeon: Kathryne Hitch, MD;  Location: [facility] OR;  Service: Orthopedics;  Laterality: Left;   TUBAL LIGATION  1984   Social History   Occupational History   Not on file  Tobacco Use   Smoking status: Never   Smokeless tobacco: Never  Vaping Use   Vaping status: Never Used  Substance and Sexual Activity   Alcohol use: Yes    Comment: Occas beer   Drug use: No   Sexual activity: Not Currently    Birth control/protection: Surgical, Post-menopausal    Comment: 1st intercourse 13yo-5 partners, btl

## 2023-10-28 ENCOUNTER — Other Ambulatory Visit: Payer: Self-pay | Admitting: Nurse Practitioner

## 2023-10-28 DIAGNOSIS — Z1231 Encounter for screening mammogram for malignant neoplasm of breast: Secondary | ICD-10-CM

## 2023-11-29 ENCOUNTER — Encounter: Payer: Self-pay | Admitting: Physician Assistant

## 2023-11-29 ENCOUNTER — Ambulatory Visit: Admitting: Physician Assistant

## 2023-11-29 DIAGNOSIS — M1712 Unilateral primary osteoarthritis, left knee: Secondary | ICD-10-CM | POA: Diagnosis not present

## 2023-11-29 MED ORDER — LIDOCAINE HCL 1 % IJ SOLN
3.0000 mL | INTRAMUSCULAR | Status: AC | PRN
  Administered 2023-11-29: 3 mL

## 2023-11-29 MED ORDER — METHYLPREDNISOLONE ACETATE 40 MG/ML IJ SUSP
40.0000 mg | INTRAMUSCULAR | Status: AC | PRN
Start: 2023-11-29 — End: 2023-11-29
  Administered 2023-11-29: 40 mg via INTRA_ARTICULAR

## 2023-11-29 NOTE — Progress Notes (Signed)
   Procedure Note  Patient: Evelyn Nelson             Date of Birth: [birth year]           MRN: [id number]             Visit Date: 11/29/2023  HPI: Evelyn Nelson comes in today requesting cortisone injection left knee last injection was 09/06/2023.  States that knee did well until about 5 days ago.  No new injury.  She been taking ibuprofen  for the knee pain.  9 primary arthritis of her left knee.  Physical exam: General Well-developed well-nourished female who is able to get on and off.  No assistive device. Left knee: No effusion good range of motion of the knee.   Procedures: Visit Diagnoses:  1. Primary osteoarthritis of left knee     Large Joint Inj: L knee on 11/29/2023 1:46 PM Indications: pain Details: 22 G 1.5 in needle, anterolateral approach  Arthrogram: No  Medications: 3 mL lidocaine  1 %; 40 mg methylPREDNISolone  acetate 40 MG/ML Outcome: tolerated well, no immediate complications Procedure, treatment alternatives, risks and benefits explained, specific risks discussed. Consent was given by the patient. Immediately prior to procedure a time out was called to verify the correct patient, procedure, equipment, support staff and site/side marked as required. Patient was prepped and draped in the usual sterile fashion.      Plan: Follow-up as needed.  Knows to wait at least 3 months between injections.

## 2023-12-09 ENCOUNTER — Ambulatory Visit: Admitting: Obstetrics and Gynecology

## 2023-12-09 ENCOUNTER — Ambulatory Visit (INDEPENDENT_AMBULATORY_CARE_PROVIDER_SITE_OTHER): Admitting: Obstetrics and Gynecology

## 2023-12-09 ENCOUNTER — Encounter: Payer: Self-pay | Admitting: Obstetrics and Gynecology

## 2023-12-09 ENCOUNTER — Ambulatory Visit
Admission: RE | Admit: 2023-12-09 | Discharge: 2023-12-09 | Disposition: A | Discharge status: Home / Self Care | Source: Ambulatory Visit | Attending: Nurse Practitioner

## 2023-12-09 VITALS — BP 148/86 | HR 124 | Ht 64.5 in | Wt 173.4 lb

## 2023-12-09 DIAGNOSIS — M8580 Other specified disorders of bone density and structure, unspecified site: Secondary | ICD-10-CM

## 2023-12-09 DIAGNOSIS — Z01419 Encounter for gynecological examination (general) (routine) without abnormal findings: Secondary | ICD-10-CM

## 2023-12-09 DIAGNOSIS — B009 Herpesviral infection, unspecified: Secondary | ICD-10-CM | POA: Diagnosis not present

## 2023-12-09 DIAGNOSIS — Z1331 Encounter for screening for depression: Secondary | ICD-10-CM | POA: Diagnosis not present

## 2023-12-09 DIAGNOSIS — Z9189 Other specified personal risk factors, not elsewhere classified: Secondary | ICD-10-CM | POA: Diagnosis not present

## 2023-12-09 DIAGNOSIS — N952 Postmenopausal atrophic vaginitis: Secondary | ICD-10-CM

## 2023-12-09 DIAGNOSIS — Z1231 Encounter for screening mammogram for malignant neoplasm of breast: Secondary | ICD-10-CM

## 2023-12-09 NOTE — Progress Notes (Signed)
 73 y.o. y.o. female here for annual medicare exam. No LMP recorded. Patient is postmenopausal.    [id number] presents for breast and pelvic exam. Postmenopausal - no HRT, no bleeding. Normal pap and mammogram history. H/O bilateral hip replacements due to OA. Some stress incontinence, not new for her. Denies nocturia.   Gynecologic History No LMP recorded. Patient is postmenopausal.   Contraception/Family planning: post menopausal status Sexually active: No  Health Maintenance Last Pap: 05/20/2015. Results were: Normal Last mammogram: 11/20/2021. Results were: Normal Last colonoscopy: Never. Reports Negative Cologuard 2022 Last Dexa: 10/29/2020. Results were: T-score -1.6. No FRAX d/t bilateral hip replacements, needs repeat. Desires [place]  Past medical history, past surgical history, family history and social history were all reviewed and documented in the EPIC chart. Divorced. Retired 05/2020. 2 children, live local. 3 grandchildren ages 19-28.  Body mass index is 29.3 kg/m.     12/09/2023    7:46 AM  Depression screen PHQ 2/9  Decreased Interest 0  Down, Depressed, Hopeless 0  PHQ - 2 Score 0    Blood pressure (!) 148/86, pulse (!) 124, height 5' 4.5 (1.638 m), weight 173 lb 6.4 oz (78.7 kg), SpO2 99%.  No results found for: DIAGPAP, HPVHIGH, ADEQPAP  GYN HISTORY: No results found for: DIAGPAP, HPVHIGH, ADEQPAP  OB History  Gravida Para Term Preterm AB Living  2 2 2   2   SAB IAB Ectopic Multiple Live Births          # Outcome Date GA Lbr Len/2nd Weight Sex Type Anes PTL Lv  2 Term           1 Term             Past Medical History:  Diagnosis Date   Arthritis    OA, back (epidural injection x2)  & hip   Complication of anesthesia    HSV infection    oral   Hypertension    Osteopenia 05/2016   T score -1.9 FRAX 18%/2.8%   PONV (postoperative nausea and vomiting)     Past Surgical History:  Procedure Laterality Date   TONSILLECTOMY      TOTAL HIP ARTHROPLASTY Right 04/09/2015   Procedure: RIGHT TOTAL HIP ARTHROPLASTY ANTERIOR APPROACH;  Surgeon: Arnie Lao, MD;  Location: [facility];  Service: Orthopedics;  Laterality: Right;  NEEDS RNFA   TOTAL HIP ARTHROPLASTY Left 03/08/2018   TOTAL HIP ARTHROPLASTY Left 03/08/2018   Procedure: LEFT TOTAL HIP ARTHROPLASTY ANTERIOR APPROACH;  Surgeon: Arnie Lao, MD;  Location: [facility];  Service: Orthopedics;  Laterality: Left;   TUBAL LIGATION  1984    Current Outpatient Medications on File Prior to Visit  Medication Sig Dispense Refill   aspirin  81 MG chewable tablet CHEW 1 TABLET BY MOUTH 2 TIMES DAILY 36 tablet 0   cholecalciferol  (VITAMIN D ) 1000 units tablet Take 1,000 Units by mouth daily.     GLUCOSAMINE HCL-MSM PO Take 2 tablets by mouth daily.     hydrochlorothiazide (HYDRODIURIL) 12.5 MG tablet Take 12.5 mg by mouth every morning.     MAGNESIUM PO Take 1 tablet by mouth daily.     Multiple Vitamin (MULTIVITAMIN WITH MINERALS) TABS tablet Take 1 tablet by mouth daily. Centrum Multivitamin for Women     Omega-3 Fatty Acids (FISH OIL) 1200 MG CAPS Take 1,200 mg by mouth daily. With omega-3 360 mg     No current facility-administered medications on file prior to visit.    Social History  Socioeconomic History   Marital status: Divorced    Spouse name: Not on file   Number of children: Not on file   Years of education: Not on file   Highest education level: Not on file  Occupational History   Not on file  Tobacco Use   Smoking status: Never    Passive exposure: Past   Smokeless tobacco: Never  Vaping Use   Vaping status: Never Used  Substance and Sexual Activity   Alcohol use: Yes    Comment: Occas beer   Drug use: No   Sexual activity: Not Currently    Partners: Male    Birth control/protection: Surgical, Post-menopausal    Comment: 1st intercourse 13yo-5 partners, btl  Other Topics Concern   Not on file  Social History Narrative   Not on  file   Social Drivers of Health   Financial Resource Strain: Not on file  Food Insecurity: Not on file  Transportation Needs: Not on file  Physical Activity: Not on file  Stress: Not on file  Social Connections: Not on file  Intimate Partner Violence: Not on file    Family History  Problem Relation Age of Onset   Lung cancer Father    Hodgkin's lymphoma Son      No Known Allergies    Patient's last menstrual period was No LMP recorded. Patient is postmenopausal..            Review of Systems Alls systems reviewed and are negative.     Physical Exam Constitutional:      Appearance: Normal appearance.  Genitourinary:     Vulva and urethral meatus normal.     No lesions in the vagina.     Right Labia: No rash, lesions or skin changes.    Left Labia: No lesions, skin changes or rash.    No vaginal discharge or tenderness.     Anterior vaginal prolapse present.    Moderate vaginal atrophy present.     Right Adnexa: not tender, not palpable and no mass present.    Left Adnexa: not tender, not palpable and no mass present.    No cervical motion tenderness or discharge.     Uterus is not enlarged, tender or irregular.  Breasts:    Right: Normal.     Left: Normal.  HENT:     Head: Normocephalic.  Neck:     Thyroid : No thyroid  mass, thyromegaly or thyroid  tenderness.   Cardiovascular:     Rate and Rhythm: Normal rate and regular rhythm.     Heart sounds: Normal heart sounds, S1 normal and S2 normal.  Pulmonary:     Effort: Pulmonary effort is normal.     Breath sounds: Normal breath sounds and air entry.  Abdominal:     General: There is no distension.     Palpations: Abdomen is soft. There is no mass.     Tenderness: There is no abdominal tenderness. There is no guarding or rebound.   Musculoskeletal:        General: Normal range of motion.     Cervical back: Full passive range of motion without pain, normal range of motion and neck supple. No tenderness.      Right lower leg: No edema.     Left lower leg: No edema.   Neurological:     Mental Status: She is alert.   Skin:    General: Skin is warm.   Psychiatric:        Mood and  Affect: Mood normal.        Behavior: Behavior normal.        Thought Content: Thought content normal.  Vitals and nursing note reviewed. Exam conducted with a chaperone present.   Joy, CMA was present the entire exam    A:         Well Woman GYN exam                             P:        Pap smear not indicated Encouraged annual mammogram screening Colon cancer screening up-to-date DXA ordered today Labs and immunizations to do with PMD Discussed breast self exams Encouraged healthy lifestyle practices Encouraged Vit D and Calcium   No follow-ups on file.  Reinaldo Caras

## 2024-01-20 IMAGING — MG MM DIGITAL SCREENING BILAT W/ TOMO AND CAD
8 series · 9 of 24 positions shown · non-contrast
Comparison: Previous exam(s).

CLINICAL DATA: Screening.

EXAM:
DIGITAL SCREENING BILATERAL MAMMOGRAM WITH TOMOSYNTHESIS AND CAD
TECHNIQUE: Bilateral screening digital craniocaudal and mediolateral oblique
mammograms were obtained. Bilateral screening digital breast
tomosynthesis was performed. The images were evaluated with
computer-aided detection.

[L CC synth-2D]
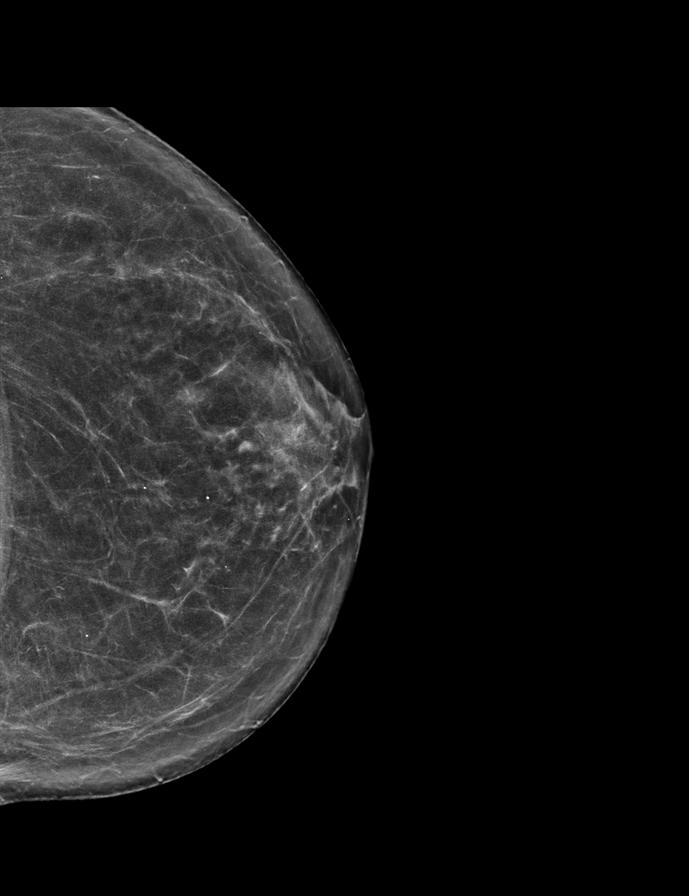

[R MLO synth-2D]
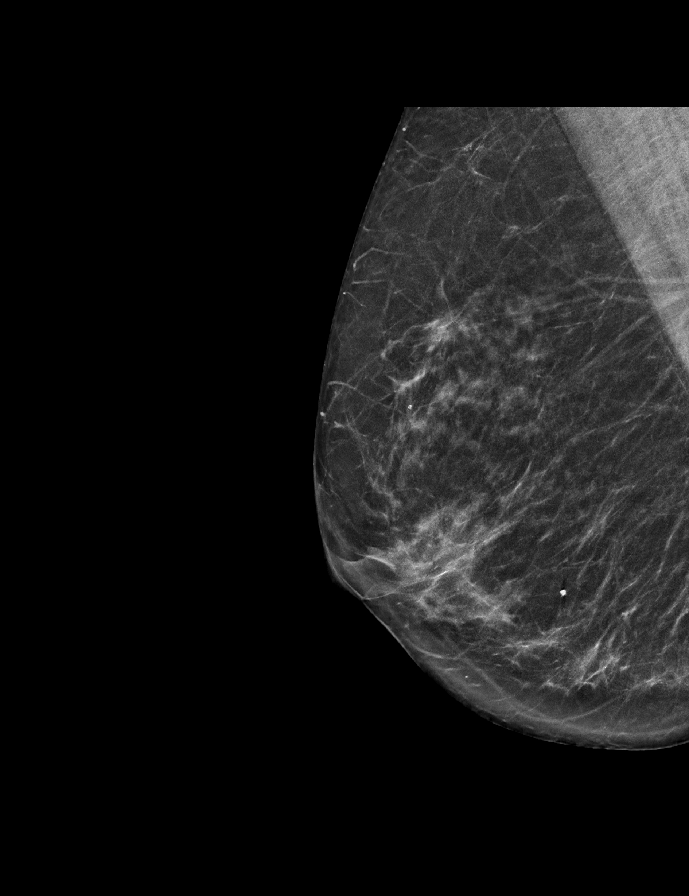

[R CC synth-2D]
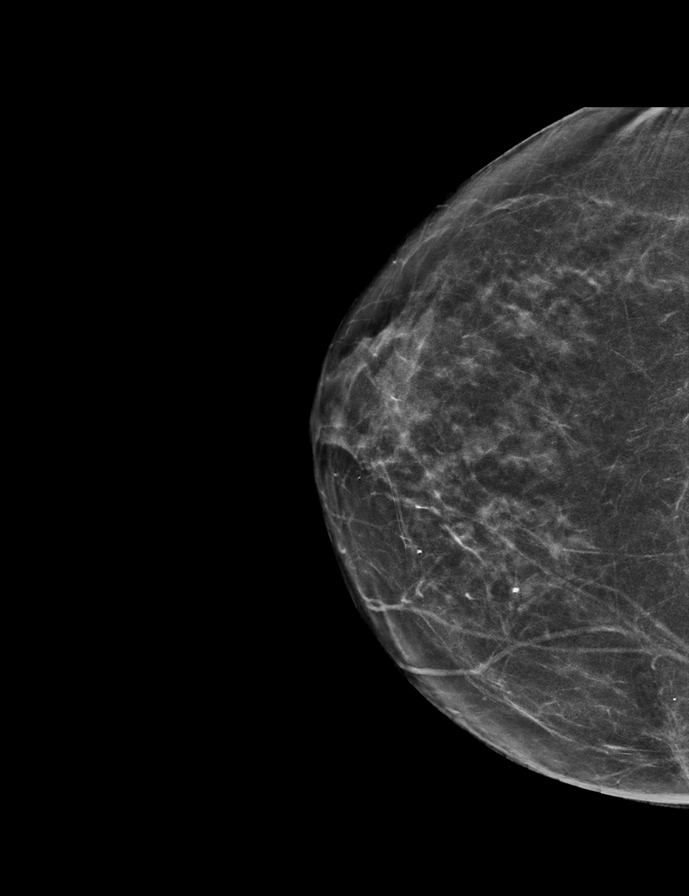

[L MLO synth-2D]
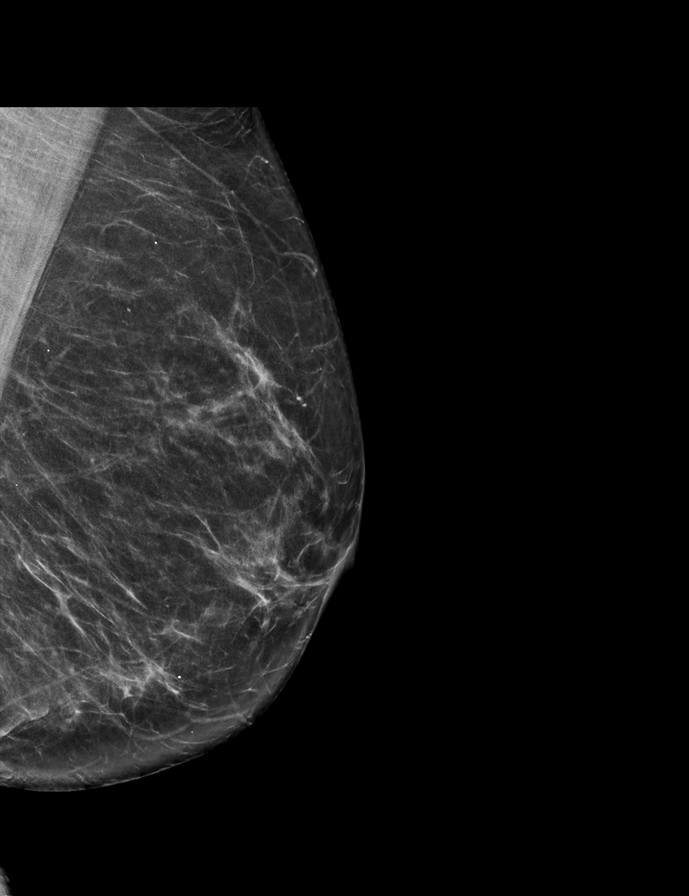

[L CC tomo · 2 of 66 frames shown]
[frame 22/66]
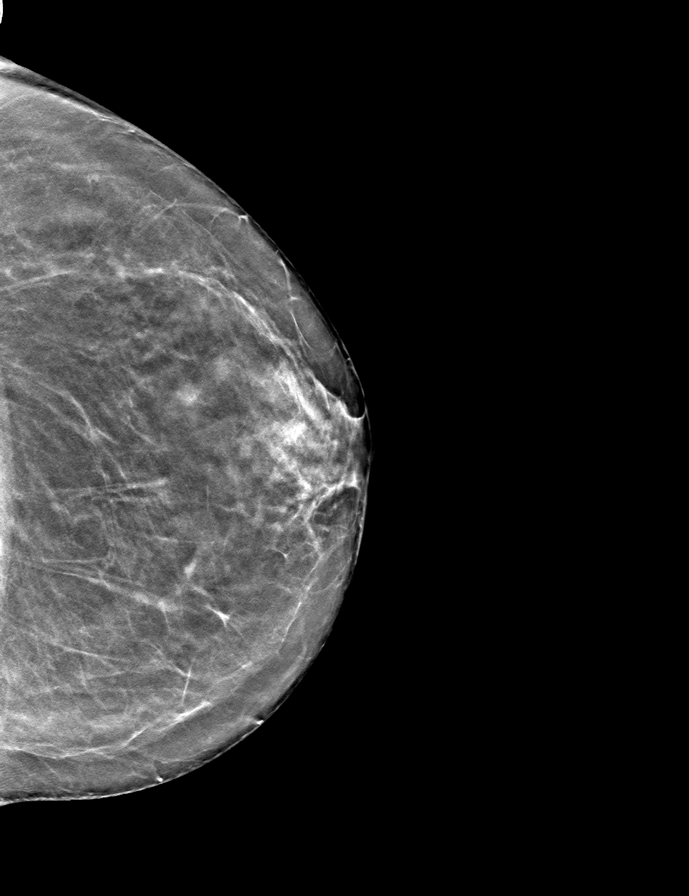
[frame 33/66]
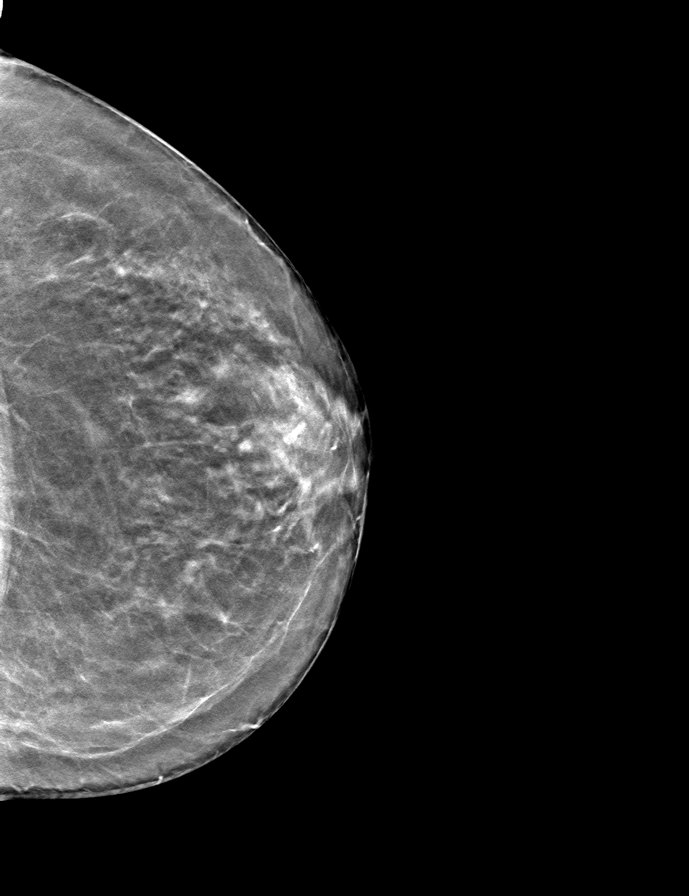

[R CC tomo · tomo slice 33/66.0]
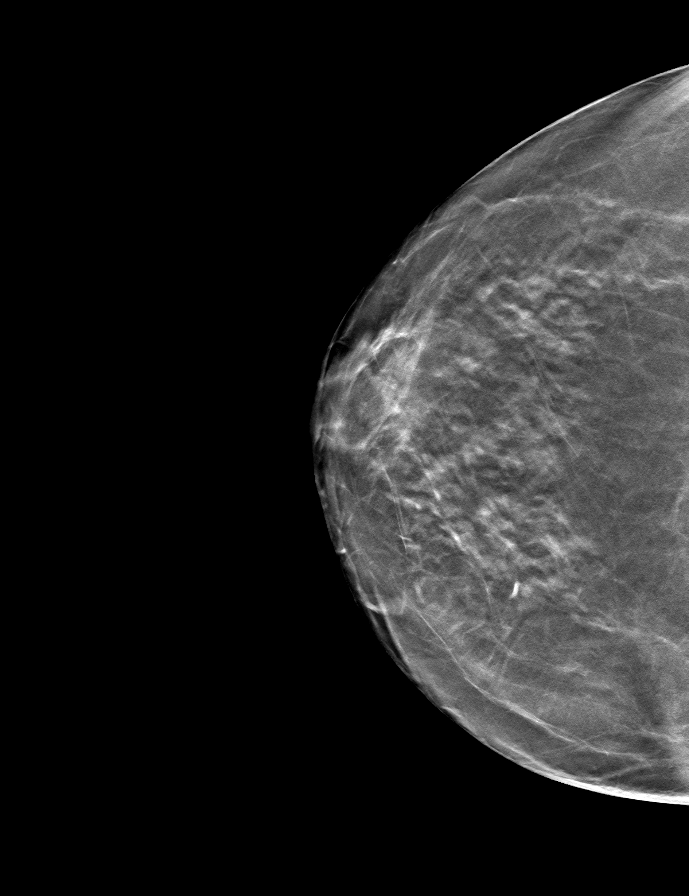

[L MLO tomo · tomo slice 35/68.0]
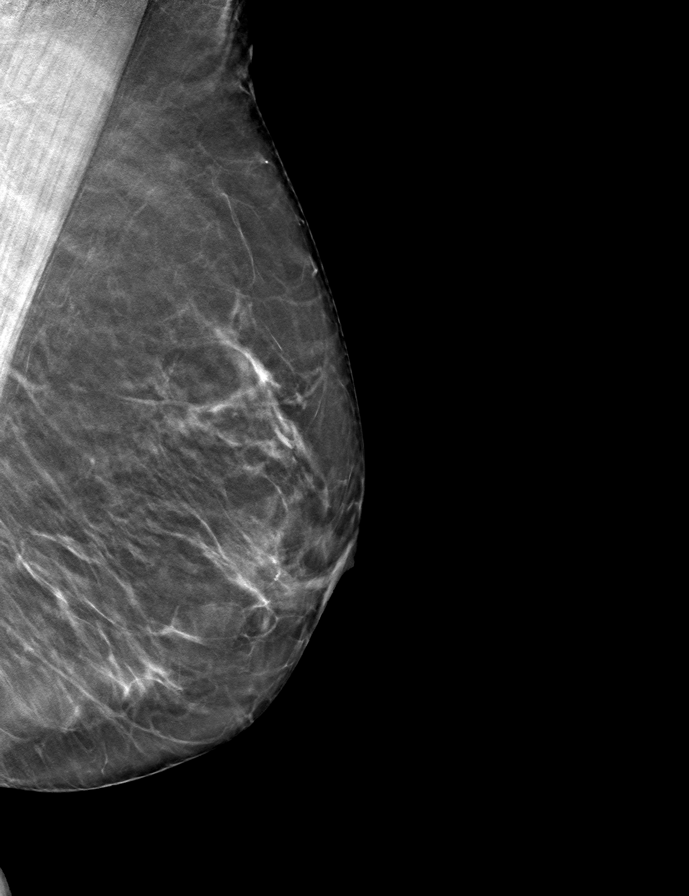

[R MLO tomo · tomo slice 31/62.0]
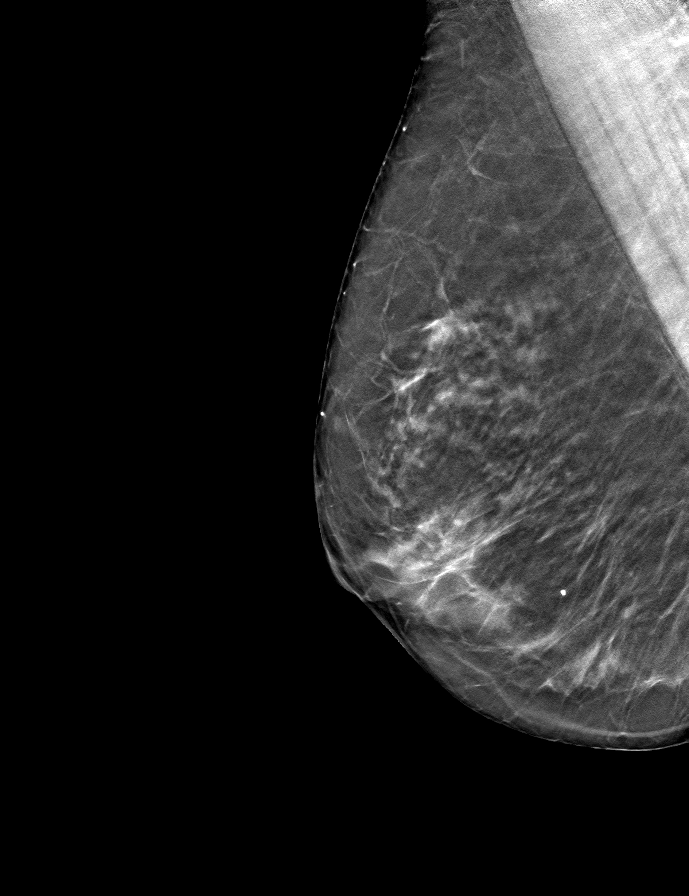

[9 of 24 positions shown; findings below may reference images not displayed]

ACR Breast Density Category b: There are scattered areas of
fibroglandular density.
FINDINGS: There are no findings suspicious for malignancy.
IMPRESSION: No mammographic evidence of malignancy. A result letter of this
screening mammogram will be mailed directly to the patient.

RECOMMENDATION:
Screening mammogram in one year. (Code:51-O-LD2)

BI-RADS CATEGORY  1: Negative.

## 2024-03-21 ENCOUNTER — Inpatient Hospital Stay

## 2024-03-21 ENCOUNTER — Encounter: Payer: Self-pay | Admitting: Hematology and Oncology

## 2024-03-21 ENCOUNTER — Inpatient Hospital Stay: Attending: Hematology and Oncology | Admitting: Hematology and Oncology

## 2024-03-21 DIAGNOSIS — I10 Essential (primary) hypertension: Secondary | ICD-10-CM | POA: Diagnosis not present

## 2024-03-21 DIAGNOSIS — Z801 Family history of malignant neoplasm of trachea, bronchus and lung: Secondary | ICD-10-CM | POA: Diagnosis not present

## 2024-03-21 DIAGNOSIS — Z807 Family history of other malignant neoplasms of lymphoid, hematopoietic and related tissues: Secondary | ICD-10-CM | POA: Insufficient documentation

## 2024-03-21 LAB — CBC WITH DIFFERENTIAL (CANCER CENTER ONLY)
Abs Immature Granulocytes: 0.02 K/uL (ref 0.00–0.07)
Basophils Absolute: 0.1 K/uL (ref 0.0–0.1)
Basophils Relative: 1 %
Eosinophils Absolute: 0.2 K/uL (ref 0.0–0.5)
Eosinophils Relative: 3 %
HCT: 35.7 % — ABNORMAL LOW (ref 36.0–46.0)
Hemoglobin: 12.5 g/dL (ref 12.0–15.0)
Immature Granulocytes: 0 %
Lymphocytes Relative: 29 %
Lymphs Abs: 2 K/uL (ref 0.7–4.0)
MCH: 31.8 pg (ref 26.0–34.0)
MCHC: 35 g/dL (ref 30.0–36.0)
MCV: 90.8 fL (ref 80.0–100.0)
Monocytes Absolute: 0.5 K/uL (ref 0.1–1.0)
Monocytes Relative: 7 %
Neutro Abs: 4.2 K/uL (ref 1.7–7.7)
Neutrophils Relative %: 60 %
Platelet Count: 318 K/uL (ref 150–400)
RBC: 3.93 MIL/uL (ref 3.87–5.11)
RDW: 13.1 % (ref 11.5–15.5)
Smear Review: NORMAL
WBC Count: 7 K/uL (ref 4.0–10.5)
nRBC: 0 % (ref 0.0–0.2)

## 2024-03-21 LAB — CMP (CANCER CENTER ONLY)
ALT: 12 U/L (ref 0–44)
AST: 20 U/L (ref 15–41)
Albumin: 4.6 g/dL (ref 3.5–5.0)
Alkaline Phosphatase: 66 U/L (ref 38–126)
Anion gap: 7 (ref 5–15)
BUN: 13 mg/dL (ref 8–23)
CO2: 29 mmol/L (ref 22–32)
Calcium: 9.7 mg/dL (ref 8.9–10.3)
Chloride: 94 mmol/L — ABNORMAL LOW (ref 98–111)
Creatinine: 0.74 mg/dL (ref 0.44–1.00)
GFR, Estimated: >60 mL/min (ref >60)
Glucose, Bld: 97 mg/dL (ref 70–99)
Potassium: 4.1 mmol/L (ref 3.5–5.1)
Sodium: 130 mmol/L — ABNORMAL LOW (ref 135–145)
Total Bilirubin: 0.5 mg/dL (ref 0.0–1.2)
Total Protein: 7 g/dL (ref 6.5–8.1)

## 2024-03-21 LAB — C-REACTIVE PROTEIN: CRP: 0.5 mg/dL (ref <1.0)

## 2024-03-21 LAB — SEDIMENTATION RATE: Sed Rate: 5 mm/h (ref 0–22)

## 2024-03-21 NOTE — Progress Notes (Signed)
 [facility] CONSULT NOTE  Patient Care Team: Montey Lot, DEVONNA as PCP - General (Physician Assistant)   ASSESSMENT & PLAN Iron overload The patient was noted to have elevated ferritin level in the absence of anemia recently Her iron saturation is normal I suspect the elevated ferritin level likely represent acute phase reactant; in her case, likely secondary to joint inflammation MRI from last year shows some mild effusion and changes on the left knee  However, her daughter mentioned that she also have elevated iron level I will order repeat iron studies along with inflammatory marker and DNA test for hemochromatosis I will call her with test results There is no indication for phlebotomy yet  Orders Placed This Encounter  Procedures   CBC with Differential (Cancer Center Only)    Standing Status:   Future    Number of Occurrences:   1    Expiration Date:   03/21/2025   Sedimentation rate    Standing Status:   Future    Number of Occurrences:   1    Expiration Date:   03/21/2025   C-reactive protein    Standing Status:   Future    Number of Occurrences:   1    Expiration Date:   03/21/2025   Hemochromatosis DNA, PCR    Standing Status:   Future    Number of Occurrences:   1    Expiration Date:   03/21/2025   CMP (Cancer Center only)    Standing Status:   Future    Number of Occurrences:   1    Expected Date:   03/21/2024    Expiration Date:   03/21/2025   Ferritin    Standing Status:   Future    Expiration Date:   03/21/2025   Almarie Bedford, MD  03/21/2024 3:19 PM  The total time spent in the appointment was 55 minutes encounter with patients including review of chart and various tests results, discussions about plan of care and coordination of care plan   All questions were answered. The patient knows to call the clinic with any problems, questions or concerns. No barriers to learning was detected.  Almarie Bedford, MD 9/30/20253:19 PM  CHIEF  COMPLAINTS/PURPOSE OF CONSULTATION:  Elevated ferritin, worrisome for iron overload/hemochromatosis  HISTORY OF PRESENTING ILLNESS:  Evelyn Nelson 73 y.o. female is here because of elevated ferritin level She is here accompanied by her daughter She is retired She went to her primary care doctor for routine blood test The patient was not anemic but despite that, iron studies were done and ferritin level was high at 317 Iron saturation was normal Review of her electronic records did not show any evidence of abnormal liver enzymes or prior iron studies The patient does not take oral iron supplement She drinks beer occasionally She has no recent infection She have chronic osteoarthritis especially the left knee She had multiple different joint injections in the past and would need left knee replacement soon No family history of hemochromatosis that she is aware of Her father had lung cancer and died at the age of 60, mother died in her 40s from old age   MEDICAL HISTORY:  Past Medical History:  Diagnosis Date   Arthritis    OA, back (epidural injection x2)  & hip   Complication of anesthesia    HSV infection    oral   Hypertension    Osteopenia 05/2016   T score -1.9 FRAX 18%/2.8%   PONV (  postoperative nausea and vomiting)     SURGICAL HISTORY: Past Surgical History:  Procedure Laterality Date   TONSILLECTOMY     TOTAL HIP ARTHROPLASTY Right 04/09/2015   Procedure: RIGHT TOTAL HIP ARTHROPLASTY ANTERIOR APPROACH;  Surgeon: Lonni CINDERELLA Poli, MD;  Location: [facility] OR;  Service: Orthopedics;  Laterality: Right;  NEEDS RNFA   TOTAL HIP ARTHROPLASTY Left 03/08/2018   TOTAL HIP ARTHROPLASTY Left 03/08/2018   Procedure: LEFT TOTAL HIP ARTHROPLASTY ANTERIOR APPROACH;  Surgeon: Poli Lonni CINDERELLA, MD;  Location: [facility] OR;  Service: Orthopedics;  Laterality: Left;   TUBAL LIGATION  1984    SOCIAL HISTORY: Social History   Socioeconomic History   Marital status: Divorced     Spouse name: Not on file   Number of children: Not on file   Years of education: Not on file   Highest education level: Not on file  Occupational History   Not on file  Tobacco Use   Smoking status: Never    Passive exposure: Past   Smokeless tobacco: Never  Vaping Use   Vaping status: Never Used  Substance and Sexual Activity   Alcohol use: Yes    Comment: Occas beer   Drug use: No   Sexual activity: Not Currently    Partners: Male    Birth control/protection: Surgical, Post-menopausal    Comment: 1st intercourse 13yo-5 partners, btl  Other Topics Concern   Not on file  Social History Narrative   Not on file   Social Drivers of Health   Financial Resource Strain: Not on file  Food Insecurity: No Food Insecurity (03/21/2024)   Hunger Vital Sign    Worried About Running Out of Food in the Last Year: Never true    Ran Out of Food in the Last Year: Never true  Transportation Needs: No Transportation Needs (03/21/2024)   PRAPARE - Administrator, Civil Service (Medical): No    Lack of Transportation (Non-Medical): No  Physical Activity: Not on file  Stress: Not on file  Social Connections: Not on file  Intimate Partner Violence: Not At Risk (03/21/2024)   Humiliation, Afraid, Rape, and Kick questionnaire    Fear of Current or Ex-Partner: No    Emotionally Abused: No    Physically Abused: No    Sexually Abused: No    FAMILY HISTORY: Family History  Problem Relation Age of Onset   Lung cancer Father    Hodgkin's lymphoma Son    Breast cancer Neg Hx    BRCA 1/2 Neg Hx     ALLERGIES:  is allergic to hydrochlorothiazide and sulfa  antibiotics.  MEDICATIONS:  Current Outpatient Medications  Medication Sig Dispense Refill   glucosamine-chondroitin 500-400 MG tablet Take 1 tablet by mouth 2 (two) times daily.     NON FORMULARY Take 1 capsule by mouth daily. Probotic     phenazopyridine (PYRIDIUM) 95 MG tablet Take 95 mg by mouth daily as needed for pain.      aspirin  81 MG chewable tablet CHEW 1 TABLET BY MOUTH 2 TIMES DAILY 36 tablet 0   cholecalciferol  (VITAMIN D ) 1000 units tablet Take 1,000 Units by mouth daily.     Multiple Vitamin (MULTIVITAMIN WITH MINERALS) TABS tablet Take 1 tablet by mouth daily. Centrum Multivitamin for Women     Omega-3 Fatty Acids (FISH OIL) 1200 MG CAPS Take 1,200 mg by mouth daily. With omega-3 360 mg     No current facility-administered medications for this visit.    REVIEW OF SYSTEMS:  Constitutional: Denies fevers, chills or abnormal night sweats Eyes: Denies blurriness of vision, double vision or watery eyes Ears, nose, mouth, throat, and face: Denies mucositis or sore throat Respiratory: Denies cough, dyspnea or wheezes Cardiovascular: Denies palpitation, chest discomfort or lower extremity swelling Gastrointestinal:  Denies nausea, heartburn or change in bowel habits Skin: Denies abnormal skin rashes Lymphatics: Denies new lymphadenopathy or easy bruising Neurological:Denies numbness, tingling or new weaknesses Behavioral/Psych: Mood is stable, no new changes  All other systems were reviewed with the patient and are negative.  PHYSICAL EXAMINATION: ECOG PERFORMANCE STATUS: 0 - Asymptomatic  Vitals:   03/21/24 1324  BP: (!) 184/95  Pulse: 100  Resp: 18  Temp: 97.7 F (36.5 C)  SpO2: 100%   Filed Weights   03/21/24 1324  Weight: 170 lb 9.6 oz (77.4 kg)    GENERAL:alert, no distress and comfortable NEURO: no focal motor/sensory deficits  LABORATORY DATA:  I have reviewed the data as listed Recent Results (from the past 2160 hours)  CMP (Cancer Center only)     Status: Abnormal   Collection Time: 03/21/24  2:06 PM  Result Value Ref Range   Sodium 130 (L) 135 - 145 mmol/L   Potassium 4.1 3.5 - 5.1 mmol/L   Chloride 94 (L) 98 - 111 mmol/L   CO2 29 22 - 32 mmol/L   Glucose, Bld 97 70 - 99 mg/dL    Comment: Glucose reference range applies only to samples taken after fasting for at least  8 hours.   BUN 13 8 - 23 mg/dL   Creatinine 9.25 9.55 - 1.00 mg/dL   Calcium 9.7 8.9 - 89.6 mg/dL   Total Protein 7.0 6.5 - 8.1 g/dL   Albumin 4.6 3.5 - 5.0 g/dL   AST 20 15 - 41 U/L   ALT 12 0 - 44 U/L   Alkaline Phosphatase 66 38 - 126 U/L   Total Bilirubin 0.5 0.0 - 1.2 mg/dL   GFR, Estimated >39 >39 mL/min    Comment: (NOTE) Calculated using the CKD-EPI Creatinine Equation (2021)    Anion gap 7 5 - 15    Comment: Performed at [facility], [address], [place], [place] [address]  CBC with Differential (Cancer Center Only)     Status: Abnormal   Collection Time: 03/21/24  2:06 PM  Result Value Ref Range   WBC Count 7.0 4.0 - 10.5 K/uL   RBC 3.93 3.87 - 5.11 MIL/uL   Hemoglobin 12.5 12.0 - 15.0 g/dL   HCT 64.2 (L) 63.9 - 53.9 %   MCV 90.8 80.0 - 100.0 fL   MCH 31.8 26.0 - 34.0 pg   MCHC 35.0 30.0 - 36.0 g/dL   RDW 86.8 88.4 - 84.4 %   Platelet Count 318 150 - 400 K/uL    Comment: PLATELET COUNT CONFIRMED BY SMEAR   nRBC 0.0 0.0 - 0.2 %   Neutrophils Relative % 60 %   Neutro Abs 4.2 1.7 - 7.7 K/uL   Lymphocytes Relative 29 %   Lymphs Abs 2.0 0.7 - 4.0 K/uL   Monocytes Relative 7 %   Monocytes Absolute 0.5 0.1 - 1.0 K/uL   Eosinophils Relative 3 %   Eosinophils Absolute 0.2 0.0 - 0.5 K/uL   Basophils Relative 1 %   Basophils Absolute 0.1 0.0 - 0.1 K/uL   WBC Morphology MORPHOLOGY UNREMARKABLE    RBC Morphology MORPHOLOGY UNREMARKABLE    Smear Review Normal platelet morphology    Immature Granulocytes  0 %   Abs Immature Granulocytes 0.02 0.00 - 0.07 K/uL    Comment: Performed at [facility], [address], [place], [place] [address]

## 2024-03-21 NOTE — Assessment & Plan Note (Signed)
 The patient was noted to have elevated ferritin level in the absence of anemia recently Her iron saturation is normal I suspect the elevated ferritin level likely represent acute phase reactant; in her case, likely secondary to joint inflammation MRI from last year shows some mild effusion and changes on the left knee  However, her daughter mentioned that she also have elevated iron level I will order repeat iron studies along with inflammatory marker and DNA test for hemochromatosis I will call her with test results There is no indication for phlebotomy yet

## 2024-03-23 ENCOUNTER — Ambulatory Visit: Payer: Self-pay | Admitting: Hematology and Oncology

## 2024-03-27 ENCOUNTER — Telehealth: Payer: Self-pay

## 2024-03-27 LAB — HEMOCHROMATOSIS DNA-PCR(C282Y,H63D)

## 2024-03-27 NOTE — Telephone Encounter (Signed)
 Called daughter and given message from Dr. Rick, Pls call her daughter  PCR for hemochromatosis is negative. Unfortunately it was the day when the power went out and we could not add ferritin to her sample, but based on the result done with her local PCP, the number is less than 300, no need phlebotomy.  She does not need any treatment.  Given above message to daughter and she verbalized understanding.

## 2024-04-20 ENCOUNTER — Ambulatory Visit: Admitting: Physician Assistant

## 2024-04-20 DIAGNOSIS — M1712 Unilateral primary osteoarthritis, left knee: Secondary | ICD-10-CM | POA: Diagnosis not present

## 2024-04-20 MED ORDER — METHYLPREDNISOLONE ACETATE 40 MG/ML IJ SUSP
40.0000 mg | INTRAMUSCULAR | Status: AC | PRN
  Administered 2024-04-20: 40 mg via INTRA_ARTICULAR

## 2024-04-20 MED ORDER — LIDOCAINE HCL 1 % IJ SOLN
5.0000 mL | INTRAMUSCULAR | Status: AC | PRN
  Administered 2024-04-20: 5 mL

## 2024-04-20 NOTE — Progress Notes (Addendum)
 "  Office Visit Note   Patient: Evelyn Nelson           Date of Birth: 04-22-1951           MRN: [id number] Visit Date: 04/20/2024              Requested by: Montey Lot, PA-C [address] [place],  [place] [address] PCP: Montey Lot, PA-C   Assessment & Plan: Visit Diagnoses:  1. Primary osteoarthritis of left knee     Plan: She understands to wait at least 3 months between cortisone injections.  She tolerated the aspiration injection of the knee well today.  Follow-up as needed.  Follow-Up Instructions: Return if symptoms worsen or fail to improve.   Orders:  Orders Placed This Encounter  Procedures   Large Joint Inj: L knee   Meds ordered this encounter  Medications   lidocaine  (XYLOCAINE ) 1 % (with pres) injection 5 mL   methylPREDNISolone  acetate (DEPO-MEDROL ) injection 40 mg      Procedures: Large Joint Inj: L knee on 04/20/2024 5:14 PM Indications: pain Details: 22 G 1.5 in needle, superolateral approach  Arthrogram: No  Medications: 5 mL lidocaine  1 %; 40 mg methylPREDNISolone  acetate 40 MG/ML Aspirate: 14 mL yellow Outcome: tolerated well, no immediate complications Procedure, treatment alternatives, risks and benefits explained, specific risks discussed. Consent was given by the patient. Immediately prior to procedure a time out was called to verify the correct patient, procedure, equipment, support staff and site/side marked as required. Patient was prepped and draped in the usual sterile fashion.       Clinical Data: No additional findings.   Subjective: Chief Complaint  Patient presents with   Left Knee - Pain    HPI Evelyn Nelson comes in today requesting left knee cortisone injection and aspiration.  She states the knee did well after her last injection back in June of this year.  Pain started 3 weeks ago no known injury.  She notes some swelling about the knee.  Denies any fevers chills.  Nondiabetic.  Review of Systems See  HPI Objective: Vital Signs: There were no vitals taken for this visit.  Physical Exam Constitutional:      Appearance: She is not ill-appearing or diaphoretic.  Neurological:     Mental Status: She is alert.     Ortho Exam Left knee good range of motion.  No abnormal warmth erythema.  Positive effusion Specialty Comments:  No specialty comments available.  Imaging: No results found.   PMFS History: Patient Active Problem List   Diagnosis Date Noted   Iron overload 03/21/2024   Status post total replacement of left hip 03/08/2018   Unilateral primary osteoarthritis, left hip 01/10/2018   Pain in left hip 08/18/2017   Left-sided low back pain with left-sided sciatica 08/18/2017   Left wrist pain 05/18/2016   Closed fracture of left distal radius and ulna, with routine healing, subsequent encounter 04/17/2016   Osteoarthritis of right hip 04/09/2015   Status post total replacement of right hip 04/09/2015   Osteopenia 03/17/2012   Past Medical History:  Diagnosis Date   Arthritis    OA, back (epidural injection x2)  & hip   Complication of anesthesia    HSV infection    oral   Hypertension    Osteopenia 05/2016   T score -1.9 FRAX 18%/2.8%   PONV (postoperative nausea and vomiting)     Family History  Problem Relation Age of Onset   Lung cancer  Father    Hodgkin's lymphoma Son    Breast cancer Neg Hx    BRCA 1/2 Neg Hx     Past Surgical History:  Procedure Laterality Date   TONSILLECTOMY     TOTAL HIP ARTHROPLASTY Right 04/09/2015   Procedure: RIGHT TOTAL HIP ARTHROPLASTY ANTERIOR APPROACH;  Surgeon: Lonni CINDERELLA Poli, MD;  Location: [facility] OR;  Service: Orthopedics;  Laterality: Right;  NEEDS RNFA   TOTAL HIP ARTHROPLASTY Left 03/08/2018   TOTAL HIP ARTHROPLASTY Left 03/08/2018   Procedure: LEFT TOTAL HIP ARTHROPLASTY ANTERIOR APPROACH;  Surgeon: Poli Lonni CINDERELLA, MD;  Location: [facility] OR;  Service: Orthopedics;  Laterality: Left;   TUBAL LIGATION  1984    Social History   Occupational History   Not on file  Tobacco Use   Smoking status: Never    Passive exposure: Past   Smokeless tobacco: Never  Vaping Use   Vaping status: Never Used  Substance and Sexual Activity   Alcohol use: Yes    Comment: Occas beer   Drug use: No   Sexual activity: Not Currently    Partners: Male    Birth control/protection: Surgical, Post-menopausal    Comment: 1st intercourse 13yo-5 partners, btl        "

## 2024-05-10 ENCOUNTER — Ambulatory Visit: Attending: Cardiology | Admitting: Cardiology

## 2024-05-10 ENCOUNTER — Encounter: Payer: Self-pay | Admitting: Cardiology

## 2024-05-10 VITALS — BP 174/90 | HR 75 | Resp 16 | Ht 64.0 in | Wt 175.2 lb

## 2024-05-10 DIAGNOSIS — R55 Syncope and collapse: Secondary | ICD-10-CM

## 2024-05-10 DIAGNOSIS — I10 Essential (primary) hypertension: Secondary | ICD-10-CM

## 2024-05-10 MED ORDER — HYDRALAZINE HCL 10 MG PO TABS: 10.0000 mg | ORAL_TABLET | Freq: Two times a day (BID) | ORAL | 3 refills | Status: AC

## 2024-05-10 NOTE — Progress Notes (Signed)
 Cardiology Office Note:    Date:  05/10/2024  NAME:  Evelyn Nelson    MRN: [id number] DOB:  1951/06/10   PCP:  Montey Lot, PA-C  Former Cardiology Providers: None Primary Cardiologist:  None, [facility] (established care 05/10/2024) Electrophysiologist:  None   Referring MD: Evelyn Dwayne NOVAK, FNP  Reason of Consult: Near syncope  Chief Complaint  Patient presents with   Near Syncope   New Patient (Initial Visit)    History of Present Illness:    Evelyn Nelson is a 73 y.o. Caucasian female whose past medical history and cardiovascular risk factors includes: Hypertension.  She is being seen today for the evaluation of near syncope at the request of Evelyn Dwayne NOVAK, FNP.  Patient is accompanied by her daughter Evelyn Nelson at today's office visit.  Near-syncope: Patient states that she has had this issue for many years, likely 20 years Last episode was June/July 2025. The episode in June/July 2025 was in the setting of decreased oral hydration and food, drug reaction from HCTZ, causing diffuse rash.  Patient states that EMS had noted low blood pressures at that time.  Medication was discontinued patient was placed on steroids and has also followed up with dermatology.  She has not had any reoccurrence. In the workup she also has followed up with hematology and at that time was recommended to increase her oral hydration with consuming at least 3 servings of protein.  She is now drinking at least 5 bottles of water and added 3 servings of protein.  His overall symptoms of lightheaded, dizziness have improved.  When she has episodes of near syncope her prodromal symptoms include feeling lightheaded/dizzy, nausea.  Patient denies having any vision changes. Precipitating factors are usually being in hot showers. If she is able to recognize her prodromal symptoms and lays down and raising her legs she can prevent further progression and symptoms actually  improve.  Hypertension: Was started on hydrochlorothiazide in the past but discontinued due to a rash Currently not on antihypertensive medications. Patient and daughter would like further assistance. Patient states that morning blood pressures are usually lower, around 130 mmHg.  Evening blood pressures are usually higher on 150 mmHg Patient denies any focal neurological deficits.  Current Medications: Current Meds  Medication Sig   aspirin  81 MG chewable tablet CHEW 1 TABLET BY MOUTH 2 TIMES DAILY   cholecalciferol  (VITAMIN D ) 1000 units tablet Take 1,000 Units by mouth daily.   glucosamine-chondroitin 500-400 MG tablet Take 1 tablet by mouth 2 (two) times daily.   hydrALAZINE (APRESOLINE) 10 MG tablet Take 1 tablet (10 mg total) by mouth 2 (two) times daily.   Multiple Vitamin (MULTIVITAMIN WITH MINERALS) TABS tablet Take 1 tablet by mouth daily. Centrum Multivitamin for Women   NON FORMULARY Take 1 capsule by mouth daily. Probotic   Omega-3 Fatty Acids (FISH OIL) 1200 MG CAPS Take 1,200 mg by mouth daily. With omega-3 360 mg   phenazopyridine (PYRIDIUM) 95 MG tablet Take 95 mg by mouth daily as needed for pain.     Allergies:    Hydrochlorothiazide and Sulfa  antibiotics   Past Medical History: Past Medical History:  Diagnosis Date   Arthritis    OA, back (epidural injection x2)  & hip   Complication of anesthesia    HSV infection    oral   Hypertension    Osteopenia 05/2016   T score -1.9 FRAX 18%/2.8%   PONV (postoperative nausea and vomiting)     Past  Surgical History: Past Surgical History:  Procedure Laterality Date   TONSILLECTOMY     TOTAL HIP ARTHROPLASTY Right 04/09/2015   Procedure: RIGHT TOTAL HIP ARTHROPLASTY ANTERIOR APPROACH;  Surgeon: Lonni CINDERELLA Poli, MD;  Location: [facility] OR;  Service: Orthopedics;  Laterality: Right;  NEEDS RNFA   TOTAL HIP ARTHROPLASTY Left 03/08/2018   TOTAL HIP ARTHROPLASTY Left 03/08/2018   Procedure: LEFT TOTAL HIP  ARTHROPLASTY ANTERIOR APPROACH;  Surgeon: Poli Lonni CINDERELLA, MD;  Location: [facility] OR;  Service: Orthopedics;  Laterality: Left;   TUBAL LIGATION  1984    Social History: Social History   Tobacco Use   Smoking status: Never    Passive exposure: Past   Smokeless tobacco: Never  Vaping Use   Vaping status: Never Used  Substance Use Topics   Alcohol use: Yes    Comment: Occas beer   Drug use: No    Family History: Family History  Problem Relation Age of Onset   Lung cancer Father    Hodgkin's lymphoma Son    Breast cancer Neg Hx    BRCA 1/2 Neg Hx     ROS:   Review of Systems  Cardiovascular:  Positive for near-syncope (See HPI, no recent event). Negative for chest pain, claudication, irregular heartbeat, leg swelling, orthopnea, palpitations, paroxysmal nocturnal dyspnea and syncope.  Respiratory:  Negative for shortness of breath.   Hematologic/Lymphatic: Negative for bleeding problem.    EKGs/Labs/Other Studies Reviewed:   EKG: EKG Interpretation Date/Time:  Wednesday May 10 2024 09:42:55 EST Ventricular Rate:  76 PR Interval:  192 QRS Duration:  80 QT Interval:  368 QTC Calculation: 414 R Axis:   -46  Text Interpretation: Normal sinus rhythm Left anterior fascicular block When compared with ECG of 27-Mar-2015 12:09, Left anterior fascicular block is now Present Confirmed by Michele Richardson [phone]) on 05/10/2024 10:08:34 AM  Labs:    Latest Ref Rng & Units 03/21/2024    2:06 PM 09/04/2019   11:24 AM 08/22/2018   10:53 AM  CBC  WBC 4.0 - 10.5 K/uL 7.0  6.3  6.6   Hemoglobin 12.0 - 15.0 g/dL 87.4  85.3  87.4   Hematocrit 36.0 - 46.0 % 35.7  42.8  36.4   Platelets 150 - 400 K/uL 318  337  333        Latest Ref Rng & Units 03/21/2024    2:06 PM 09/04/2019   11:24 AM 08/22/2018   10:53 AM  BMP  Glucose 70 - 99 mg/dL 97  92  97   BUN 8 - 23 mg/dL 13  11  11    Creatinine 0.44 - 1.00 mg/dL 9.25  9.23  9.19   BUN/Creat Ratio 6 - 22 (calc)  NOT APPLICABLE  NOT  APPLICABLE   Sodium 135 - 145 mmol/L 130  132  133   Potassium 3.5 - 5.1 mmol/L 4.1  5.2  5.0   Chloride 98 - 111 mmol/L 94  94  96   CO2 22 - 32 mmol/L 29  27  27    Calcium 8.9 - 10.3 mg/dL 9.7  89.8  9.9       Latest Ref Rng & Units 03/21/2024    2:06 PM 09/04/2019   11:24 AM 08/22/2018   10:53 AM  CMP  Glucose 70 - 99 mg/dL 97  92  97   BUN 8 - 23 mg/dL 13  11  11    Creatinine 0.44 - 1.00 mg/dL 9.25  9.23  9.19   Sodium 135 -  145 mmol/L 130  132  133   Potassium 3.5 - 5.1 mmol/L 4.1  5.2  5.0   Chloride 98 - 111 mmol/L 94  94  96   CO2 22 - 32 mmol/L 29  27  27    Calcium 8.9 - 10.3 mg/dL 9.7  89.8  9.9   Total Protein 6.5 - 8.1 g/dL 7.0  7.3  7.2   Total Bilirubin 0.0 - 1.2 mg/dL 0.5  0.7  0.5   Alkaline Phos 38 - 126 U/L 66     AST 15 - 41 U/L 20  24  123   ALT 0 - 44 U/L 12  15  137     Lab Results  Component Value Date   CHOL 270 (H) 09/04/2019   HDL 138 09/04/2019   LDLCALC 116 (H) 09/04/2019   TRIG 67 09/04/2019   CHOLHDL 2.0 09/04/2019   No results for input(s): LIPOA in the last 8760 hours. No components found for: NTPROBNP No results for input(s): PROBNP in the last 8760 hours. No results for input(s): TSH in the last 8760 hours.  Physical Exam:    Today's Vitals   05/10/24 0940  BP: (!) 174/90  Pulse: 75  Resp: 16  SpO2: 100%  Weight: 175 lb 3.2 oz (79.5 kg)  Height: 5' 4 (1.626 m)   Body mass index is 30.07 kg/m. Wt Readings from Last 3 Encounters:  05/10/24 175 lb 3.2 oz (79.5 kg)  03/21/24 170 lb 9.6 oz (77.4 kg)  12/09/23 173 lb 6.4 oz (78.7 kg)    Orthostatic VS for the past 24 hrs (Last 3 readings):  BP- Lying Pulse- Lying BP- Sitting Pulse- Sitting BP- Standing at 0 minutes Pulse- Standing at 0 minutes  05/10/24 0949 184/83 72 (!) 181/98 72 (!) 173/100 77    Physical Exam  Constitutional: No distress.  hemodynamically stable  Neck: No JVD present.  Cardiovascular: Normal rate, regular rhythm, S1 normal and S2 normal. Exam  reveals no gallop, no S3 and no S4.  No murmur heard. Pulmonary/Chest: Effort normal and breath sounds normal. No stridor. She has no wheezes. She has no rales.  Musculoskeletal:        General: No edema.     Cervical back: Neck supple.  Skin: Skin is warm.     Impression & Recommendation(s):  Impression:   ICD-10-CM   1. Near syncope  R55 EKG 12-Lead    ECHOCARDIOGRAM COMPLETE    2. Benign hypertension  I10        Recommendation(s):  Near syncope Episodes triggered by prolonged standing, heat, or dehydration. Improved with hydration and protein intake. - Only Increase salt intake on days with increased lightheadedness or dizziness. - Seek hospital care if experiencing unexplained syncope or increased frequency of symptoms. - No episodes of syncope, as per patient and daughter that they can recall. - Likely vasovagal/orthostatic mediated - Avoid prolonged hot showers - Echo will be ordered to evaluate for structural heart disease and left ventricular systolic function.  Benign hypertension Blood pressure fluctuates, higher in the evening. Hydralazine chosen for minimal electrolyte impact and age-related vascular suitability. - Prescribed hydralazine 10 mg twice daily. - Monitor blood pressure, aiming for readings < and ideally closer to . - Avoid diuretics to prevent electrolyte imbalance.  Daughter states that she has a history of hyponatremia.  Patient's daughter also verbalizes the utility of coronary calcium score given her symptoms. Not ideally recommended for workup of near syncope. However, more than  happy to arrange it if patient feels strongly about it.  I have asked him to consider this and also discuss with PCP. Would focus on improving her modifiable cardiovascular risk factors and to make sure that she gets screened appropriately for hyperlipidemia, diabetes, and other cardiovascular risk factors. They will call us  back if they wish to proceed with  coronary calcium score.  Orders Placed:  Orders Placed This Encounter  Procedures   EKG 12-Lead   ECHOCARDIOGRAM COMPLETE    Where should this test be performed[facility]    Does the patient weigh less than or greater than 250 lbs?:   Patient weighs less than 250 lbs    Perflutren DEFINITY (image enhancing agent) should be administered unless hypersensitivity or allergy exist:   Administer Perflutren    Reason for exam-Echo:   Other-Full Diagnosis List    Full ICD-10/Reason for Exam:   Near syncope [307698]     Final Medication List:    Meds ordered this encounter  Medications   hydrALAZINE (APRESOLINE) 10 MG tablet    Sig: Take 1 tablet (10 mg total) by mouth 2 (two) times daily.    Dispense:  180 tablet    Refill:  3    There are no discontinued medications.   Current Outpatient Medications:    aspirin  81 MG chewable tablet, CHEW 1 TABLET BY MOUTH 2 TIMES DAILY, Disp: 36 tablet, Rfl: 0   cholecalciferol  (VITAMIN D ) 1000 units tablet, Take 1,000 Units by mouth daily., Disp: , Rfl:    glucosamine-chondroitin 500-400 MG tablet, Take 1 tablet by mouth 2 (two) times daily., Disp: , Rfl:    hydrALAZINE (APRESOLINE) 10 MG tablet, Take 1 tablet (10 mg total) by mouth 2 (two) times daily., Disp: 180 tablet, Rfl: 3   Multiple Vitamin (MULTIVITAMIN WITH MINERALS) TABS tablet, Take 1 tablet by mouth daily. Centrum Multivitamin for Women, Disp: , Rfl:    NON FORMULARY, Take 1 capsule by mouth daily. Probotic, Disp: , Rfl:    Omega-3 Fatty Acids (FISH OIL) 1200 MG CAPS, Take 1,200 mg by mouth daily. With omega-3 360 mg, Disp: , Rfl:    phenazopyridine (PYRIDIUM) 95 MG tablet, Take 95 mg by mouth daily as needed for pain., Disp: , Rfl:   Consent:   NA  Disposition:   6 month follow up APP  1 year follow up with me   Patient may be asked to follow-up sooner based on the results of the above-mentioned testing.  Her questions and concerns were addressed to her satisfaction.  She voices understanding of the recommendations provided during this encounter.    Signed, Madonna Michele HAS, [facility]  A Division of [facility] [address], [place], [place] [address]  05/10/2024 10:46 AM

## 2024-05-10 NOTE — Patient Instructions (Signed)
 Medication Instructions:  START Hydralazine  (Apresoline ) 10 mg, take one (1) tablet twice daily.  *If you need a refill on your cardiac medications before your next appointment, please call your pharmacy*  Lab Work: None ordered If you have labs (blood work) drawn today and your tests are completely normal, you will receive your results only by: MyChart Message (if you have MyChart) OR A paper copy in the mail If you have any lab test that is abnormal or we need to change your treatment, we will call you to review the results.  Testing/Procedures: ECHOCARDIOGRAM  Follow-Up: At [facility], you and your health needs are our priority.  As part of our continuing mission to provide you with exceptional heart care, our providers are all part of one team.  This team includes your primary Cardiologist (physician) and Advanced Practice Providers or APPs (Physician Assistants and Nurse Practitioners) who all work together to provide you with the care you need, when you need it.  Your next appointment:   6 month(s)  Provider:   Any APP    We recommend signing up for the patient portal called MyChart.  Sign up information is provided on this After Visit Summary.  MyChart is used to connect with patients for Virtual Visits (Telemedicine).  Patients are able to view lab/test results, encounter notes, upcoming appointments, etc.  Non-urgent messages can be sent to your provider as well.   To learn more about what you can do with MyChart, go to [email or web address].   Other Instructions Your physician has requested that you have an echocardiogram. Echocardiography is a painless test that uses sound waves to create images of your heart. It provides your doctor with information about the size and shape of your heart and how well your heart's chambers and valves are working. This procedure takes approximately one hour. There are no restrictions for this procedure. Please do NOT wear cologne,  perfume, aftershave, or lotions (deodorant is allowed). Please arrive 15 minutes prior to your appointment time.  Please note: We ask at that you not bring children with you during ultrasound (echo/ vascular) testing. Due to room size and safety concerns, children are not allowed in the ultrasound rooms during exams. Our front office staff cannot provide observation of children in our lobby area while testing is being conducted. An adult accompanying a patient to their appointment will only be allowed in the ultrasound room at the discretion of the ultrasound technician under special circumstances. We apologize for any inconvenience.

## 2024-06-09 ENCOUNTER — Ambulatory Visit (HOSPITAL_BASED_OUTPATIENT_CLINIC_OR_DEPARTMENT_OTHER): Admitting: Radiology

## 2024-06-09 ENCOUNTER — Ambulatory Visit (HOSPITAL_BASED_OUTPATIENT_CLINIC_OR_DEPARTMENT_OTHER)
Admission: RE | Admit: 2024-06-09 | Discharge: 2024-06-09 | Disposition: A | Discharge status: Home / Self Care | Payer: Self-pay | Attending: Family Medicine | Admitting: Family Medicine

## 2024-06-09 ENCOUNTER — Ambulatory Visit (HOSPITAL_BASED_OUTPATIENT_CLINIC_OR_DEPARTMENT_OTHER): Payer: Self-pay | Admitting: Family Medicine

## 2024-06-09 ENCOUNTER — Encounter (HOSPITAL_BASED_OUTPATIENT_CLINIC_OR_DEPARTMENT_OTHER): Payer: Self-pay

## 2024-06-09 VITALS — BP 170/88 | HR 71 | Temp 98.2°F | Resp 16

## 2024-06-09 DIAGNOSIS — M545 Low back pain, unspecified: Secondary | ICD-10-CM | POA: Diagnosis not present

## 2024-06-09 MED ORDER — DEXAMETHASONE SOD PHOSPHATE PF 10 MG/ML IJ SOLN
10.0000 mg | Freq: Once | INTRAMUSCULAR | Status: AC
  Administered 2024-06-09: 10 mg via INTRAMUSCULAR

## 2024-06-09 MED ORDER — TIZANIDINE HCL 4 MG PO TABS: 4.0000 mg | ORAL_TABLET | Freq: Three times a day (TID) | ORAL | 0 refills | Status: AC | PRN

## 2024-06-09 NOTE — Discharge Instructions (Addendum)
 I will call and get the official results of your x-ray. Steroid injection given here today for pain and inflammation.  You can do extra strength Tylenol  as needed.  I am prescribing a low-dose muscle relaxer to use as needed.  Recommend alternating heat and ice to the area and gentle stretching.  Follow-up with neurosurgery for any continued issues

## 2024-06-09 NOTE — ED Provider Notes (Signed)
 " [facility]    CSN: [id number] Arrival date & time: 06/09/24  1145      History   Chief Complaint Chief Complaint  Patient presents with   Back Pain    Entered by patient    HPI Evelyn Nelson is a 73 y.o. female.   Pt states she picked up a case of water on Tuesday and since she haas lower back pain. She has been taking IBU as needed with very little relief.     Back Pain   Past Medical History:  Diagnosis Date   Arthritis    OA, back (epidural injection x2)  & hip   Complication of anesthesia    HSV infection    oral   Hypertension    Osteopenia 05/2016   T score -1.9 FRAX 18%/2.8%   PONV (postoperative nausea and vomiting)     Patient Active Problem List   Diagnosis Date Noted   Iron overload 03/21/2024   Status post total replacement of left hip 03/08/2018   Unilateral primary osteoarthritis, left hip 01/10/2018   Pain in left hip 08/18/2017   Left-sided low back pain with left-sided sciatica 08/18/2017   Left wrist pain 05/18/2016   Closed fracture of left distal radius and ulna, with routine healing, subsequent encounter 04/17/2016   Osteoarthritis of right hip 04/09/2015   Status post total replacement of right hip 04/09/2015   Osteopenia 03/17/2012    Past Surgical History:  Procedure Laterality Date   TONSILLECTOMY     TOTAL HIP ARTHROPLASTY Right 04/09/2015   Procedure: RIGHT TOTAL HIP ARTHROPLASTY ANTERIOR APPROACH;  Surgeon: Lonni CINDERELLA Poli, MD;  Location: [facility] OR;  Service: Orthopedics;  Laterality: Right;  NEEDS RNFA   TOTAL HIP ARTHROPLASTY Left 03/08/2018   TOTAL HIP ARTHROPLASTY Left 03/08/2018   Procedure: LEFT TOTAL HIP ARTHROPLASTY ANTERIOR APPROACH;  Surgeon: Poli Lonni CINDERELLA, MD;  Location: [facility] OR;  Service: Orthopedics;  Laterality: Left;   TUBAL LIGATION  1984    OB History     Gravida  2   Para  2   Term  2   Preterm      AB      Living  2      SAB      IAB      Ectopic       Multiple      Live Births               Home Medications    Prior to Admission medications  Medication Sig Start Date End Date Taking? Authorizing Provider  aspirin  81 MG chewable tablet CHEW 1 TABLET BY MOUTH 2 TIMES DAILY 03/28/18  Yes Gretta Bertrum ORN, PA-C  cholecalciferol  (VITAMIN D ) 1000 units tablet Take 1,000 Units by mouth daily.   Yes [provider]  glucosamine-chondroitin 500-400 MG tablet Take 1 tablet by mouth 2 (two) times daily.   Yes [provider]  hydrALAZINE  (APRESOLINE ) 10 MG tablet Take 1 tablet (10 mg total) by mouth 2 (two) times daily. 05/10/24  Yes Tolia, Sunit, DO  Multiple Vitamin (MULTIVITAMIN WITH MINERALS) TABS tablet Take 1 tablet by mouth daily. Centrum Multivitamin for Women   Yes [provider]  NON FORMULARY Take 1 capsule by mouth daily. Probotic   Yes [provider]  Omega-3 Fatty Acids (FISH OIL) 1200 MG CAPS Take 1,200 mg by mouth daily. With omega-3 360 mg   Yes [provider]  phenazopyridine (PYRIDIUM) 95 MG tablet Take  95 mg by mouth daily as needed for pain.    [provider]    Family History Family History  Problem Relation Age of Onset   Lung cancer Father    Hodgkin's lymphoma Son    Breast cancer Neg Hx    BRCA 1/2 Neg Hx     Social History Social History[1]   Allergies   Hydrochlorothiazide and Sulfa  antibiotics   Review of Systems Review of Systems  Musculoskeletal:  Positive for back pain.     Physical Exam Triage Vital Signs ED Triage Vitals  Encounter Vitals Group     BP 06/09/24 1156 (!) 170/88     Girls Systolic BP Percentile --      Girls Diastolic BP Percentile --      Boys Systolic BP Percentile --      Boys Diastolic BP Percentile --      Pulse Rate 06/09/24 1156 71     Resp 06/09/24 1156 16     Temp 06/09/24 1156 98.2 F (36.8 C)     Temp Source 06/09/24 1156 Oral     SpO2 06/09/24 1156 98 %     Weight --      Height --      Head  Circumference --      Peak Flow --      Pain Score 06/09/24 1154 8     Pain Loc --      Pain Education --      Exclude from Growth Chart --    No data found.  Updated Vital Signs BP (!) 170/88 (BP Location: Right Arm)   Pulse 71   Temp 98.2 F (36.8 C) (Oral)   Resp 16   SpO2 98%   Visual Acuity Right Eye Distance:   Left Eye Distance:   Bilateral Distance:    Right Eye Near:   Left Eye Near:    Bilateral Near:     Physical Exam   UC Treatments / Results  Labs (all labs ordered are listed, but only abnormal results are displayed) Labs Reviewed - No data to display  EKG   Radiology No results found.  Procedures Procedures (including critical care time)  Medications Ordered in UC Medications - No data to display  Initial Impression / Assessment and Plan / UC Course  I have reviewed the triage vital signs and the nursing notes.  Pertinent labs & imaging results that were available during my care of the patient were reviewed by me and considered in my medical decision making (see chart for details).     *** Final Clinical Impressions(s) / UC Diagnoses   Final diagnoses:  None   Discharge Instructions   None    ED Prescriptions   None    PDMP not reviewed this encounter.    [1]  Social History Tobacco Use   Smoking status: Never    Passive exposure: Past   Smokeless tobacco: Never  Vaping Use   Vaping status: Never Used  Substance Use Topics   Alcohol use: Yes    Comment: Occas beer   Drug use: No   "

## 2024-06-09 NOTE — ED Triage Notes (Signed)
 Pt states she picked up a case of water on Tuesday and since she haas lower back pain. She has been taking IBU as needed with very little relief.

## 2024-06-21 ENCOUNTER — Ambulatory Visit (HOSPITAL_COMMUNITY)
Admission: RE | Admit: 2024-06-21 | Discharge: 2024-06-21 | Disposition: A | Discharge status: Home / Self Care | Source: Ambulatory Visit | Attending: Student in an Organized Health Care Education/Training Program | Admitting: Student in an Organized Health Care Education/Training Program

## 2024-06-21 DIAGNOSIS — R55 Syncope and collapse: Secondary | ICD-10-CM | POA: Insufficient documentation

## 2024-06-21 LAB — ECHOCARDIOGRAM COMPLETE
AR max vel: 3.06 cm2
AV Area VTI: 2.78 cm2
AV Area mean vel: 2.69 cm2
AV Mean grad: 6 mmHg
AV Peak grad: 9.5 mmHg
Ao pk vel: 1.54 m/s
Area-P 1/2: 4.83 cm2
MV M vel: 6.39 m/s
MV Peak grad: 163.3 mmHg
MV VTI: 2.36 cm2
P 1/2 time: 443 ms
S' Lateral: 2.3 cm

## 2024-06-26 ENCOUNTER — Ambulatory Visit: Payer: Self-pay | Admitting: Cardiology

## 2024-06-26 NOTE — Progress Notes (Signed)
 [name],  Left ventricular ejection fraction (pumping activity) of the heart is within the normal limit.   Other notable findings include: Grade 2 diastolic dysfunction, mild mitral stenosis, dilatation of the proximal ascending aorta at 40 mm.  Please monitor blood pressures at home.  Move up her appointment to discuss these results and reevaluate symptoms and workup.   Please arrange a follow-up echo in 1 year December 2026 for ascending aorta dilatation.  Sincerely,  Dr. Michele

## 2024-06-30 ENCOUNTER — Other Ambulatory Visit: Payer: Self-pay | Admitting: Nurse Practitioner

## 2024-06-30 DIAGNOSIS — M5416 Radiculopathy, lumbar region: Secondary | ICD-10-CM

## 2024-07-06 ENCOUNTER — Ambulatory Visit: Admitting: Physician Assistant

## 2024-07-06 ENCOUNTER — Other Ambulatory Visit (INDEPENDENT_AMBULATORY_CARE_PROVIDER_SITE_OTHER): Payer: Self-pay

## 2024-07-06 DIAGNOSIS — M25511 Pain in right shoulder: Secondary | ICD-10-CM

## 2024-07-06 DIAGNOSIS — G8929 Other chronic pain: Secondary | ICD-10-CM

## 2024-07-06 MED ORDER — METHYLPREDNISOLONE ACETATE 40 MG/ML IJ SUSP
40.0000 mg | INTRAMUSCULAR | Status: AC | PRN
  Administered 2024-07-06: 40 mg via INTRA_ARTICULAR

## 2024-07-06 MED ORDER — LIDOCAINE HCL 1 % IJ SOLN
3.0000 mL | INTRAMUSCULAR | Status: AC | PRN
  Administered 2024-07-06: 3 mL

## 2024-07-06 NOTE — Progress Notes (Signed)
" ° ° ° ° ° ° ° ° ° °  Procedure Note  Patient: Evelyn Nelson             Date of Birth: [birth year]           MRN: [id number]             Visit Date: 07/06/2024  HPI: This is Koestner comes in today mediated right shoulder pain.  We have seen her for this in the past.  She was last given a subacromial injection on 07/13/2022 and this gave her good relief.  She continues to do shoulder exercises.  She states abduction of the arm causes increased pain especially over the last 2 weeks no known injury.  She been taking Tylenol  for pain.  She denies  fevers or chills.  Review of systems: See HPI  Physical exam: General no acute distress. Bilateral shoulders 5 out of 5 strength with external and internal rotation against resistance.  Empty can test is negative bilaterally.  Impingement testing positive on the right negative on the left.  Adduction negative for [facility] joint pain.  Nontender over the [facility] joints bilaterally.  Liftoff test negative bilaterally      Radiographs: Right shoulder 3 views: Shoulder is well located.  Glenohumeral joint overall well-preserved.  High riding humeral head consistent with rotator cuff arthropathy.  Moderate AC joint changes.  No acute fractures.  Procedures: Visit Diagnoses:  1. Chronic right shoulder pain     Large Joint Inj: R subacromial bursa on 07/06/2024 3:47 PM Indications: pain Details: 22 G 1.5 in needle, superior approach  Arthrogram: No  Medications: 3 mL lidocaine  1 %; 40 mg methylPREDNISolone  acetate 40 MG/ML Outcome: tolerated well, no immediate complications Procedure, treatment alternatives, risks and benefits explained, specific risks discussed. Consent was given by the patient. Immediately prior to procedure a time out was called to verify the correct patient, procedure, equipment, support staff and site/side marked as required. Patient was prepped and draped in the usual sterile fashion.    Plan: She will follow-up as needed she needs to  wait for least 3 months between injections.  She we will continue her shoulder exercises.    "

## 2024-07-18 NOTE — Telephone Encounter (Signed)
-----   Message from [place], [place] sent at 06/26/2024  1:45 PM EST ----- [name],  Left ventricular ejection fraction (pumping activity) of the heart is within the normal limit.   Other notable findings include: Grade 2 diastolic dysfunction, mild mitral stenosis, dilatation of the proximal ascending aorta at 40 mm.  Please monitor blood pressures at home.  Move up her appointment to discuss these results and reevaluate symptoms and workup.   Please arrange a follow-up echo in 1 year December 2026 for ascending aorta dilatation.  Sincerely,  Dr. Michele

## 2024-07-18 NOTE — Telephone Encounter (Signed)
 Spoke with patient. Relayed Dr. Tyree note. All concerns addressed. Appointment scheduled for 4/1

## 2024-07-29 ENCOUNTER — Other Ambulatory Visit

## 2024-09-20 ENCOUNTER — Ambulatory Visit: Admitting: Cardiology
